# Patient Record
Sex: Female | Born: 1935 | Race: White | Hispanic: No | Smoking: Former smoker
Health system: Southern US, Community
[De-identification: ages and names within clinical notes are randomized; demographics above are authoritative.]

## PROBLEM LIST (undated history)

## (undated) DIAGNOSIS — R413 Other amnesia: Secondary | ICD-10-CM

## (undated) DIAGNOSIS — C50912 Malignant neoplasm of unspecified site of left female breast: Secondary | ICD-10-CM

## (undated) DIAGNOSIS — E876 Hypokalemia: Secondary | ICD-10-CM

## (undated) DIAGNOSIS — IMO0002 Reserved for concepts with insufficient information to code with codable children: Secondary | ICD-10-CM

## (undated) DIAGNOSIS — F32A Depression, unspecified: Secondary | ICD-10-CM

## (undated) DIAGNOSIS — E039 Hypothyroidism, unspecified: Secondary | ICD-10-CM

## (undated) DIAGNOSIS — I10 Essential (primary) hypertension: Secondary | ICD-10-CM

## (undated) DIAGNOSIS — M899 Disorder of bone, unspecified: Secondary | ICD-10-CM

## (undated) DIAGNOSIS — I509 Heart failure, unspecified: Secondary | ICD-10-CM

## (undated) DIAGNOSIS — E119 Type 2 diabetes mellitus without complications: Secondary | ICD-10-CM

## (undated) DIAGNOSIS — C50911 Malignant neoplasm of unspecified site of right female breast: Secondary | ICD-10-CM

## (undated) DIAGNOSIS — M810 Age-related osteoporosis without current pathological fracture: Secondary | ICD-10-CM

## (undated) DIAGNOSIS — F0393 Unspecified dementia, unspecified severity, with mood disturbance: Secondary | ICD-10-CM

## (undated) DIAGNOSIS — E785 Hyperlipidemia, unspecified: Secondary | ICD-10-CM

## (undated) DIAGNOSIS — K219 Gastro-esophageal reflux disease without esophagitis: Secondary | ICD-10-CM

## (undated) DIAGNOSIS — M545 Low back pain, unspecified: Secondary | ICD-10-CM

## (undated) DIAGNOSIS — F329 Major depressive disorder, single episode, unspecified: Secondary | ICD-10-CM

## (undated) DIAGNOSIS — F0390 Unspecified dementia without behavioral disturbance: Secondary | ICD-10-CM

## (undated) DIAGNOSIS — K5900 Constipation, unspecified: Secondary | ICD-10-CM

## (undated) DIAGNOSIS — M949 Disorder of cartilage, unspecified: Secondary | ICD-10-CM

## (undated) DIAGNOSIS — S42293A Other displaced fracture of upper end of unspecified humerus, initial encounter for closed fracture: Secondary | ICD-10-CM

## (undated) DIAGNOSIS — F419 Anxiety disorder, unspecified: Secondary | ICD-10-CM

## (undated) DIAGNOSIS — R011 Cardiac murmur, unspecified: Secondary | ICD-10-CM

## (undated) DIAGNOSIS — I4891 Unspecified atrial fibrillation: Secondary | ICD-10-CM

## (undated) DIAGNOSIS — R5381 Other malaise: Secondary | ICD-10-CM

## (undated) DIAGNOSIS — N1330 Unspecified hydronephrosis: Secondary | ICD-10-CM

## (undated) HISTORY — DX: Heart failure, unspecified: I50.9

## (undated) HISTORY — DX: Low back pain, unspecified: M54.50

## (undated) HISTORY — DX: Unspecified dementia, unspecified severity, with mood disturbance: F03.93

## (undated) HISTORY — DX: Unspecified dementia without behavioral disturbance: F03.90

## (undated) HISTORY — DX: Gastro-esophageal reflux disease without esophagitis: K21.9

## (undated) HISTORY — DX: Unspecified atrial fibrillation: I48.91

## (undated) HISTORY — DX: Hyperlipidemia, unspecified: E78.5

## (undated) HISTORY — DX: Major depressive disorder, single episode, unspecified: F32.9

## (undated) HISTORY — PX: TONSILLECTOMY: SHX5217

## (undated) HISTORY — DX: Cardiac murmur, unspecified: R01.1

## (undated) HISTORY — DX: Depression, unspecified: F32.A

## (undated) HISTORY — PX: APPENDECTOMY: SHX54

## (undated) HISTORY — DX: Essential (primary) hypertension: I10

## (undated) HISTORY — DX: Malignant neoplasm of unspecified site of right female breast: C50.911

## (undated) HISTORY — DX: Disorder of cartilage, unspecified: M94.9

## (undated) HISTORY — DX: Constipation, unspecified: K59.00

## (undated) HISTORY — DX: Anxiety disorder, unspecified: F41.9

## (undated) HISTORY — DX: Reserved for concepts with insufficient information to code with codable children: IMO0002

## (undated) HISTORY — DX: Hypothyroidism, unspecified: E03.9

## (undated) HISTORY — DX: Hypokalemia: E87.6

## (undated) HISTORY — DX: Low back pain: M54.5

## (undated) HISTORY — DX: Unspecified hydronephrosis: N13.30

## (undated) HISTORY — DX: Malignant neoplasm of unspecified site of left female breast: C50.912

## (undated) HISTORY — DX: Other displaced fracture of upper end of unspecified humerus, initial encounter for closed fracture: S42.293A

## (undated) HISTORY — DX: Other amnesia: R41.3

## (undated) HISTORY — DX: Disorder of bone, unspecified: M89.9

## (undated) HISTORY — DX: Other malaise: R53.81

## (undated) HISTORY — DX: Age-related osteoporosis without current pathological fracture: M81.0

## (undated) HISTORY — DX: Type 2 diabetes mellitus without complications: E11.9

---

## 2000-03-29 ENCOUNTER — Encounter: Payer: Self-pay | Admitting: General Surgery

## 2000-04-01 ENCOUNTER — Ambulatory Visit (HOSPITAL_COMMUNITY): Admission: RE | Admit: 2000-04-01 | Discharge: 2000-04-01 | Payer: Self-pay | Admitting: General Surgery

## 2000-04-01 ENCOUNTER — Encounter (INDEPENDENT_AMBULATORY_CARE_PROVIDER_SITE_OTHER): Payer: Self-pay

## 2000-11-01 HISTORY — PX: CORONARY ARTERY BYPASS GRAFT: SHX141

## 2000-12-06 ENCOUNTER — Encounter: Admission: RE | Admit: 2000-12-06 | Discharge: 2001-03-06 | Payer: Self-pay | Admitting: Internal Medicine

## 2000-12-16 ENCOUNTER — Encounter: Admission: RE | Admit: 2000-12-16 | Discharge: 2001-01-18 | Payer: Self-pay | Admitting: Internal Medicine

## 2002-11-01 DIAGNOSIS — C50911 Malignant neoplasm of unspecified site of right female breast: Secondary | ICD-10-CM

## 2002-11-01 HISTORY — DX: Malignant neoplasm of unspecified site of right female breast: C50.911

## 2002-11-01 HISTORY — PX: MASTECTOMY: SHX3

## 2004-04-06 ENCOUNTER — Other Ambulatory Visit: Admission: RE | Admit: 2004-04-06 | Discharge: 2004-04-06 | Payer: Self-pay | Admitting: General Surgery

## 2004-04-27 ENCOUNTER — Encounter: Admission: RE | Admit: 2004-04-27 | Discharge: 2004-04-27 | Payer: Self-pay | Admitting: General Surgery

## 2004-04-28 ENCOUNTER — Ambulatory Visit (HOSPITAL_BASED_OUTPATIENT_CLINIC_OR_DEPARTMENT_OTHER): Admission: RE | Admit: 2004-04-28 | Discharge: 2004-04-28 | Payer: Self-pay | Admitting: General Surgery

## 2004-04-28 ENCOUNTER — Ambulatory Visit (HOSPITAL_COMMUNITY): Admission: RE | Admit: 2004-04-28 | Discharge: 2004-04-28 | Payer: Self-pay | Admitting: General Surgery

## 2004-04-28 ENCOUNTER — Encounter (INDEPENDENT_AMBULATORY_CARE_PROVIDER_SITE_OTHER): Payer: Self-pay | Admitting: *Deleted

## 2004-04-28 ENCOUNTER — Encounter (INDEPENDENT_AMBULATORY_CARE_PROVIDER_SITE_OTHER): Payer: Self-pay | Admitting: General Surgery

## 2004-05-19 ENCOUNTER — Ambulatory Visit: Admission: RE | Admit: 2004-05-19 | Discharge: 2004-07-16 | Payer: Self-pay | Admitting: Radiation Oncology

## 2004-05-21 ENCOUNTER — Other Ambulatory Visit: Admission: RE | Admit: 2004-05-21 | Discharge: 2004-05-21 | Payer: Self-pay | Admitting: Obstetrics and Gynecology

## 2004-08-05 ENCOUNTER — Encounter: Admission: RE | Admit: 2004-08-05 | Discharge: 2004-08-05 | Payer: Self-pay | Admitting: Oncology

## 2004-08-05 ENCOUNTER — Encounter (INDEPENDENT_AMBULATORY_CARE_PROVIDER_SITE_OTHER): Payer: Self-pay | Admitting: *Deleted

## 2004-08-18 ENCOUNTER — Ambulatory Visit: Admission: RE | Admit: 2004-08-18 | Discharge: 2004-09-09 | Payer: Self-pay | Admitting: Radiation Oncology

## 2004-08-26 ENCOUNTER — Encounter: Admission: RE | Admit: 2004-08-26 | Discharge: 2004-08-26 | Payer: Self-pay | Admitting: General Surgery

## 2004-08-31 ENCOUNTER — Encounter: Admission: RE | Admit: 2004-08-31 | Discharge: 2004-08-31 | Payer: Self-pay | Admitting: General Surgery

## 2004-08-31 ENCOUNTER — Encounter: Payer: Self-pay | Admitting: General Surgery

## 2004-08-31 ENCOUNTER — Encounter (INDEPENDENT_AMBULATORY_CARE_PROVIDER_SITE_OTHER): Payer: Self-pay | Admitting: *Deleted

## 2004-09-10 ENCOUNTER — Ambulatory Visit (HOSPITAL_COMMUNITY): Admission: RE | Admit: 2004-09-10 | Discharge: 2004-09-10 | Payer: Self-pay | Admitting: General Surgery

## 2004-11-04 ENCOUNTER — Encounter (INDEPENDENT_AMBULATORY_CARE_PROVIDER_SITE_OTHER): Payer: Self-pay | Admitting: General Surgery

## 2004-11-04 ENCOUNTER — Inpatient Hospital Stay (HOSPITAL_COMMUNITY): Admission: RE | Admit: 2004-11-04 | Discharge: 2004-11-07 | Payer: Self-pay | Admitting: General Surgery

## 2004-11-04 ENCOUNTER — Encounter (INDEPENDENT_AMBULATORY_CARE_PROVIDER_SITE_OTHER): Payer: Self-pay | Admitting: *Deleted

## 2005-01-26 ENCOUNTER — Ambulatory Visit: Payer: Self-pay | Admitting: Oncology

## 2005-05-28 ENCOUNTER — Ambulatory Visit: Payer: Self-pay | Admitting: Oncology

## 2005-07-27 ENCOUNTER — Ambulatory Visit: Payer: Self-pay | Admitting: Oncology

## 2005-10-27 ENCOUNTER — Ambulatory Visit: Payer: Self-pay | Admitting: Oncology

## 2006-06-17 ENCOUNTER — Ambulatory Visit: Payer: Self-pay | Admitting: Oncology

## 2006-06-24 LAB — CBC WITH DIFFERENTIAL/PLATELET
BASO%: 0.2 % (ref 0.0–2.0)
Basophils Absolute: 0 10*3/uL (ref 0.0–0.1)
EOS%: 3.6 % (ref 0.0–7.0)
Eosinophils Absolute: 0.3 10*3/uL (ref 0.0–0.5)
HCT: 43.1 % (ref 34.8–46.6)
HGB: 14.5 g/dL (ref 11.6–15.9)
LYMPH%: 30.7 % (ref 14.0–48.0)
MCH: 30 pg (ref 26.0–34.0)
MCHC: 33.7 g/dL (ref 32.0–36.0)
MCV: 89.1 fL (ref 81.0–101.0)
MONO#: 0.5 10*3/uL (ref 0.1–0.9)
MONO%: 6 % (ref 0.0–13.0)
NEUT#: 5 10*3/uL (ref 1.5–6.5)
NEUT%: 59.5 % (ref 39.6–76.8)
Platelets: 227 10*3/uL (ref 145–400)
RBC: 4.83 10*6/uL (ref 3.70–5.32)
RDW: 15.6 % — ABNORMAL HIGH (ref 11.3–14.5)
WBC: 8.4 10*3/uL (ref 3.9–10.0)
lymph#: 2.6 10*3/uL (ref 0.9–3.3)

## 2006-06-24 LAB — COMPREHENSIVE METABOLIC PANEL
Alkaline Phosphatase: 42 U/L (ref 39–117)
BUN: 14 mg/dL (ref 6–23)
Glucose, Bld: 236 mg/dL — ABNORMAL HIGH (ref 70–99)
Sodium: 141 mEq/L (ref 135–145)
Total Bilirubin: 0.4 mg/dL (ref 0.3–1.2)
Total Protein: 6.9 g/dL (ref 6.0–8.3)

## 2007-06-18 ENCOUNTER — Ambulatory Visit: Payer: Self-pay | Admitting: Oncology

## 2007-11-02 HISTORY — PX: VERTEBROPLASTY: SHX113

## 2008-04-14 ENCOUNTER — Inpatient Hospital Stay (HOSPITAL_COMMUNITY): Admission: EM | Admit: 2008-04-14 | Discharge: 2008-04-18 | Payer: Self-pay | Admitting: Emergency Medicine

## 2008-04-14 ENCOUNTER — Ambulatory Visit (HOSPITAL_COMMUNITY): Admission: RE | Admit: 2008-04-14 | Discharge: 2008-04-14 | Payer: Self-pay | Admitting: Emergency Medicine

## 2008-04-16 ENCOUNTER — Encounter: Payer: Self-pay | Admitting: Neurosurgery

## 2008-04-16 ENCOUNTER — Encounter (INDEPENDENT_AMBULATORY_CARE_PROVIDER_SITE_OTHER): Payer: Self-pay | Admitting: Interventional Radiology

## 2008-08-20 ENCOUNTER — Ambulatory Visit (HOSPITAL_COMMUNITY): Admission: RE | Admit: 2008-08-20 | Discharge: 2008-08-20 | Payer: Self-pay | Admitting: Interventional Radiology

## 2008-08-22 ENCOUNTER — Encounter: Payer: Self-pay | Admitting: Interventional Radiology

## 2009-05-17 ENCOUNTER — Emergency Department (HOSPITAL_COMMUNITY): Admission: EM | Admit: 2009-05-17 | Discharge: 2009-05-17 | Payer: Self-pay | Admitting: Emergency Medicine

## 2009-05-29 ENCOUNTER — Ambulatory Visit (HOSPITAL_COMMUNITY): Admission: RE | Admit: 2009-05-29 | Discharge: 2009-05-29 | Payer: Self-pay | Admitting: Internal Medicine

## 2010-06-05 ENCOUNTER — Ambulatory Visit (HOSPITAL_COMMUNITY): Admission: RE | Admit: 2010-06-05 | Discharge: 2010-06-05 | Payer: Self-pay | Admitting: Internal Medicine

## 2010-06-05 ENCOUNTER — Ambulatory Visit: Payer: Self-pay | Admitting: Internal Medicine

## 2010-06-05 ENCOUNTER — Inpatient Hospital Stay (HOSPITAL_COMMUNITY): Admission: AD | Admit: 2010-06-05 | Discharge: 2010-06-08 | Payer: Self-pay | Admitting: Internal Medicine

## 2010-06-06 ENCOUNTER — Encounter: Payer: Self-pay | Admitting: Internal Medicine

## 2010-06-06 ENCOUNTER — Encounter (INDEPENDENT_AMBULATORY_CARE_PROVIDER_SITE_OTHER): Payer: Self-pay | Admitting: Internal Medicine

## 2010-06-11 ENCOUNTER — Telehealth: Payer: Self-pay | Admitting: Cardiology

## 2010-06-12 ENCOUNTER — Telehealth: Payer: Self-pay | Admitting: Internal Medicine

## 2010-11-18 ENCOUNTER — Other Ambulatory Visit: Payer: Self-pay | Admitting: Dermatology

## 2010-11-21 ENCOUNTER — Encounter: Payer: Self-pay | Admitting: General Surgery

## 2010-11-22 ENCOUNTER — Encounter: Payer: Self-pay | Admitting: Interventional Radiology

## 2010-12-01 NOTE — Procedures (Signed)
Summary: Upper Endoscopy  Patient: [name] Note: All result statuses are Final unless otherwise noted.  Tests: (1) Upper Endoscopy (EGD)   EGD Upper Endoscopy       DONE     [facility]     [address] [place], [place]  [address]           ENDOSCOPY PROCEDURE REPORT           PATIENT:  Selena Ward, Selena Ward  MR#:  [id number]     BIRTHDATE:  1918-09-17, 89 yrs. old  GENDER:  female           ENDOSCOPIST:  Hedwig Morton. Juanda Chance, MD     Referred by:  Murray Hodgkins, M.D.           PROCEDURE DATE:  06/06/2010     PROCEDURE:  EGD with biopsy, EGD with dilatation over guidewire     ASA CLASS:  Class III     INDICATIONS:  dysphagia barium swallow shos complete obstruction     at the level of g-e junction           MEDICATIONS:   Versed 2 mg, Fentanyl 25 mcg     TOPICAL ANESTHETIC:  Cetacaine Spray           DESCRIPTION OF PROCEDURE:   After the risks benefits and     alternatives of the procedure were thoroughly explained, informed     consent was obtained.  The  endoscope was introduced through the     mouth and advanced to the second portion of the duodenum, without     limitations.  The instrument was slowly withdrawn as the mucosa     was fully examined.     <<PROCEDUREIMAGES>>           A stricture was found in the distal esophagus. fobrotic, ulcerated     stricture covered with exudate, opened when scope gently pushed     through, appear ulcerated but no definite mass With standard     forceps, a biopsy was obtained and sent to pathology (see image7,     image6, and image8). Savary dilation over a guidewire 14mm and 16     mm dilators passed through  Presbyesophagus was found (see     image1). tortuous esophagus,  Otherwise the examination was normal     (see image5, image4, image3, and image2).    Retroflexed views     revealed no abnormalities.    The scope was then withdrawn from     the patient and the procedure completed.           COMPLICATIONS:  None       ENDOSCOPIC IMPRESSION:     1) Stricture in the distal esophagus     2) Presbyesophagus     3) Otherwise normal examination     s/p biopsies of the stricture which appears benign     s/p dilation under fluoroscopy to 70F     RECOMMENDATIONS:     1) Await biopsy results     2) Anti-reflux regimen to be follow     post dil orders     PPI           REPEAT EXAM:  In 0 year(s) for.  redilate prn           ______________________________     Hedwig Morton. Juanda Chance, MD           CC:  n.     eSIGNED:   Hedwig Morton. Brodie at 06/06/2010 12:25 PM           Jackelyn Hoehn, [id number]  Note: An exclamation mark (!) indicates a result that was not dispersed into the flowsheet. Document Creation Date: 06/08/2010 9:00 AM _______________________________________________________________________  (1) Order result status: Final Collection or observation date-time: 06/06/2010 12:10 Requested date-time:  Receipt date-time:  Reported date-time:  Referring Physician:   Ordering Physician: Lina Sar [phone]) Specimen Source:  Source: Launa Grill Order Number: [id number] Lab site:

## 2010-12-01 NOTE — Progress Notes (Signed)
Summary: f/u appt  Phone Note Call from Patient Call back at [phone]   Caller: Bonita Quin [facility] Call For: Dr. Juanda Chance Reason for Call: Talk to Nurse Summary of Call: (if Bonita Quin is not available, ask for Mia)  thinks pt needs a f/u appt after hospital procedure, but not sure when pt should come back. Initial call taken by: Vallarie Mare,  June 12, 2010 8:35 AM  Follow-up for Phone Call        appointment as needed difficullt swallowing. If she has no problem I don't have to see her. She was H.pylori positive but it does not have to be treated at her age. Follow-up by: Hart Carwin MD,  June 12, 2010 7:04 PM     Appended Document: f/u appt Nurse at [facility] notified.

## 2010-12-01 NOTE — Progress Notes (Signed)
Summary: question re appt  Phone Note Call from Patient   Caller: Patient Reason for Call: Talk to Nurse Summary of Call: friends home calling to see when pt due for hospital fu-dc paper didn't say-pls call [phone] mia or linda Initial call taken by: Glynda Jaeger,  June 11, 2010 10:16 AM  Follow-up for Phone Call        I called and spoke with Bonita Quin at [facility]. I explained they needed to call Dr. Juanda Chance in GI. I have given her the # to call.  Follow-up by: Sherri Rad, RN, BSN,  June 11, 2010 4:19 PM

## 2011-01-15 LAB — DIFFERENTIAL
Basophils Absolute: 0 10*3/uL (ref 0.0–0.1)
Basophils Relative: 0 % (ref 0–1)
Eosinophils Absolute: 0.2 10*3/uL (ref 0.0–0.7)
Eosinophils Relative: 2 % (ref 0–5)
Lymphocytes Relative: 37 % (ref 12–46)
Lymphs Abs: 3.8 10*3/uL (ref 0.7–4.0)
Monocytes Absolute: 0.9 10*3/uL (ref 0.1–1.0)
Monocytes Relative: 8 % (ref 3–12)
Neutro Abs: 5.6 10*3/uL (ref 1.7–7.7)
Neutrophils Relative %: 53 % (ref 43–77)

## 2011-01-15 LAB — CBC
HCT: 41.5 % (ref 36.0–46.0)
HCT: 41.8 % (ref 36.0–46.0)
Hemoglobin: 14.5 g/dL (ref 12.0–15.0)
MCH: 31.1 pg (ref 26.0–34.0)
MCHC: 33.8 g/dL (ref 30.0–36.0)
MCHC: 34.6 g/dL (ref 30.0–36.0)
MCV: 90 fL (ref 78.0–100.0)
MCV: 91.5 fL (ref 78.0–100.0)
Platelets: 235 10*3/uL (ref 150–400)
Platelets: 235 10*3/uL (ref 150–400)
RBC: 4.65 MIL/uL (ref 3.87–5.11)
RDW: 13.4 % (ref 11.5–15.5)
RDW: 14.2 % (ref 11.5–15.5)
WBC: 10.5 10*3/uL (ref 4.0–10.5)
WBC: 11.3 10*3/uL — ABNORMAL HIGH (ref 4.0–10.5)

## 2011-01-15 LAB — GLUCOSE, CAPILLARY
Glucose-Capillary: 101 mg/dL — ABNORMAL HIGH (ref 70–99)
Glucose-Capillary: 102 mg/dL — ABNORMAL HIGH (ref 70–99)
Glucose-Capillary: 115 mg/dL — ABNORMAL HIGH (ref 70–99)
Glucose-Capillary: 119 mg/dL — ABNORMAL HIGH (ref 70–99)
Glucose-Capillary: 120 mg/dL — ABNORMAL HIGH (ref 70–99)
Glucose-Capillary: 123 mg/dL — ABNORMAL HIGH (ref 70–99)
Glucose-Capillary: 129 mg/dL — ABNORMAL HIGH (ref 70–99)
Glucose-Capillary: 136 mg/dL — ABNORMAL HIGH (ref 70–99)
Glucose-Capillary: 151 mg/dL — ABNORMAL HIGH (ref 70–99)
Glucose-Capillary: 168 mg/dL — ABNORMAL HIGH (ref 70–99)

## 2011-01-15 LAB — COMPREHENSIVE METABOLIC PANEL
ALT: 18 U/L (ref 0–35)
AST: 23 U/L (ref 0–37)
Albumin: 4 g/dL (ref 3.5–5.2)
Alkaline Phosphatase: 34 U/L — ABNORMAL LOW (ref 39–117)
BUN: 14 mg/dL (ref 6–23)
CO2: 26 mEq/L (ref 19–32)
Calcium: 8.7 mg/dL (ref 8.4–10.5)
Chloride: 104 mEq/L (ref 96–112)
Creatinine, Ser: 0.97 mg/dL (ref 0.4–1.2)
GFR calc Af Amer: >60 mL/min (ref >60)
GFR calc non Af Amer: 54 mL/min — ABNORMAL LOW (ref >60)
Glucose, Bld: 103 mg/dL — ABNORMAL HIGH (ref 70–99)
Potassium: 3.4 mEq/L — ABNORMAL LOW (ref 3.5–5.1)
Sodium: 140 mEq/L (ref 135–145)
Total Bilirubin: 0.8 mg/dL (ref 0.3–1.2)
Total Protein: 6.9 g/dL (ref 6.0–8.3)

## 2011-01-15 LAB — BASIC METABOLIC PANEL
BUN: 14 mg/dL (ref 6–23)
Chloride: 104 mEq/L (ref 96–112)
Creatinine, Ser: 1.02 mg/dL (ref 0.4–1.2)
Glucose, Bld: 125 mg/dL — ABNORMAL HIGH (ref 70–99)

## 2011-01-15 LAB — MAGNESIUM: Magnesium: 2.4 mg/dL (ref 1.5–2.5)

## 2011-01-15 LAB — HEMOGLOBIN A1C
Hgb A1c MFr Bld: 7 % — ABNORMAL HIGH (ref <5.7)
Mean Plasma Glucose: 154 mg/dL — ABNORMAL HIGH (ref <117)

## 2011-01-15 LAB — MRSA PCR SCREENING: MRSA by PCR: NEGATIVE

## 2011-01-15 LAB — APTT: aPTT: 28 seconds (ref 24–37)

## 2011-01-15 LAB — PROTIME-INR
INR: 1.17 (ref 0.00–1.49)
Prothrombin Time: 15.1 seconds (ref 11.6–15.2)

## 2011-02-07 LAB — URINALYSIS, ROUTINE W REFLEX MICROSCOPIC
Bilirubin Urine: NEGATIVE
Hgb urine dipstick: NEGATIVE
Ketones, ur: NEGATIVE mg/dL
Nitrite: NEGATIVE
Protein, ur: 30 mg/dL — AB
Urobilinogen, UA: 0.2 mg/dL (ref 0.0–1.0)

## 2011-02-07 LAB — URINE CULTURE: Colony Count: 9000

## 2011-02-07 LAB — CBC
HCT: 45.7 % (ref 36.0–46.0)
Platelets: 240 10*3/uL (ref 150–400)
RDW: 15.4 % (ref 11.5–15.5)
WBC: 9.6 10*3/uL (ref 4.0–10.5)

## 2011-02-07 LAB — COMPREHENSIVE METABOLIC PANEL
AST: 31 U/L (ref 0–37)
Albumin: 4.3 g/dL (ref 3.5–5.2)
BUN: 14 mg/dL (ref 6–23)
Chloride: 103 mEq/L (ref 96–112)
Creatinine, Ser: 1 mg/dL (ref 0.4–1.2)
GFR calc Af Amer: >60 mL/min (ref >60)
Potassium: 4.2 mEq/L (ref 3.5–5.1)
Total Protein: 7.2 g/dL (ref 6.0–8.3)

## 2011-02-07 LAB — DIFFERENTIAL
Eosinophils Relative: 0 % (ref 0–5)
Lymphocytes Relative: 20 % (ref 12–46)
Monocytes Absolute: 0.4 10*3/uL (ref 0.1–1.0)
Monocytes Relative: 4 % (ref 3–12)
Neutro Abs: 7.2 10*3/uL (ref 1.7–7.7)

## 2011-02-07 LAB — LACTIC ACID, PLASMA: Lactic Acid, Venous: 2 mmol/L (ref 0.5–2.2)

## 2011-02-07 LAB — URINE MICROSCOPIC-ADD ON

## 2011-03-16 NOTE — Consult Note (Signed)
Selena Ward, Selena Ward              ACCOUNT NO.:  [id number]   MEDICAL RECORD NO.:  [id number]          PATIENT TYPE:  INP   LOCATION:  1423                         FACILITY:  [facility]   PHYSICIAN:  Hilda Lias, M.D.   DATE OF BIRTH:  15-Oct-1918   DATE OF CONSULTATION:  04/14/2008  DATE OF DISCHARGE:                                 CONSULTATION   Ms. Koplin is an 77 year old female who fell yesterday in the facility  where she lives.  Immediately, she developed back pain, which got worse  today.  Because of the finding, she was brought to [facility] Emergency  Room, where she was fully evaluated and x-rays were obtained.  We were  called for evaluation as well her medical doctor, Dr. Evlyn Kanner.  Clinically, the patient is awake and is oriented x3.  She is complaining  of back pain mostly with moving.  She denies having any problem in the  past.  Clinically, she has a normal sensation and she is able to move  both legs, although with some discomfort secondary to the pain.  Sensory  is normal, reflexes 1+.  She has not had any problem with bladder or  bowel movement.  The x-ray shows a fracture of L3 with 5 mm retropulsion  to the canal.  Also, she has had fracture of endplate of L2 and stenosis  at L4-L5.  Clinical impression: The fracture at L3, most likely  traumatic.  Lumbar stenosis at L4-L5, fracture at the endplate of L2.   RECOMMENDATION:  The patient is going to be admitted either by her  personal medical doctor or the hospitalist.  They are trying to find out  which one to admit her.  This lady is going to need a vertebroplasty.  This can be done at [facility] either by [place], [facility] or by the interventional radiologist to see who is available  first.  In the meantime, she is going to be admitted to the floor here  at [facility] and once we made the arrangement for vertebroplasty, she  is going to be transferred.  The decision  right now is to find out  which service will be taking care of her.  Of course, we are consultant  and we are not the primary physician.           ______________________________  Hilda Lias, M.D.     EB/MEDQ  D:  04/14/2008  T:  04/15/2008  Job:  [id number]

## 2011-03-16 NOTE — H&P (Signed)
Selena Ward, Selena Ward              ACCOUNT NO.:  [id number]   MEDICAL RECORD NO.:  [id number]          PATIENT TYPE:  INP   LOCATION:  1423                         FACILITY:  [facility]   PHYSICIAN:  Della Goo, M.D. DATE OF BIRTH:  [birth year]   DATE OF ADMISSION:  04/14/2008  DATE OF DISCHARGE:                              HISTORY & PHYSICAL   PRIMARY CARE PHYSICIAN:  Unassigned   CHIEF COMPLAINTS:  Severe back pain.   HISTORY OF PRESENT ILLNESS:  This is an 77 year old female who was  brought to the emergency department secondary to severe the unremitting  low back pain.  The patient reports suffering a fall one day before, and  reports this fall happened when she was reaching to open the  refrigerator.  She states that she did not collapse or have syncope  associated with this fall.  She denies having any dizziness or vertigo.  She also denies having any chest pain associated with this event.  The  patient describes her pain as being a 10/10 and it is located across the  low back.  There is no radiation down the legs..  She denies having any  loss of bowel or bladder function.   PAST MEDICAL HISTORY:  1. Hypertension.  2. Hyperlipidemia.  3. Type 2 diabetes mellitus.  4. History of breast CA bilaterally status post bilateral simple      mastectomies January 2006.  The patient also has had a total of      three breast surgeries initially, excision of a mass in the left      breast, then left partial mastectomy, followed by bilateral simple      mastectomies.   MEDICATIONS:  1. Lasix 20 mg one p.o. daily.  2. Lanoxin 0.125 mg one p.o. daily.  3. Perphenazine/amitriptyline 2/25 mg one p.o. q.h.s.  4. Enalapril 10 mg one p.o. daily.  5. Centrum multivitamin one p.o. daily.  6. Atenolol 50 mg one p.o. daily.  7. Potassium chloride 20 mEq one p.o. daily.  8. Aspirin 325 mg one p.o. daily.  9. Amaryl 2 mg one p.o. daily.  10.Calcium 500 mg one p.o. daily.  11.Crestor 10 mg  one p.o. daily.  12.Zetia 10 mg one p.o. daily.  13.Fosamax 70 mg one p.o. weekly.  14.Fish oil 2 capsules twice daily.  15.Coenzyme Q-10 50 mg one p.o. daily.   ALLERGIES:  Patient has Niaspan listed as an allergy in her medical  record.   SOCIAL HISTORY:  The patient is a nonsmoker, nondrinker.  She is a  resident at the [facility] assisted living and nursing home  facility.   FAMILY HISTORY:  Noncontributory.   PHYSICAL EXAMINATION FINDINGS:  GENERAL:  This is a pleasant 89-year-  old, well-nourished, well-developed female in discomfort but no acute  distress.  VITAL SIGNS:  Temperature 98.4, blood pressure 169/81, heart rate 87,  respirations 20, O2 saturations 93-96%.  HEENT:  Normocephalic, atraumatic.  There is no scleral icterus.  Pupils  are equally round, reactive to light.  Extraocular muscles are intact.  Funduscopic benign.  Oropharynx is clear.  NECK:  Supple, full range of motion.  No thyromegaly, adenopathy or  jugular venous distention.  CARDIOVASCULAR:  Regular rate and rhythm.  No murmurs, gallops or rubs.  LUNGS:  Clear to auscultation bilaterally.  ABDOMEN:  Positive bowel sounds, soft, nontender, nondistended.  EXTREMITIES:  Without cyanosis, clubbing or edema.Marland Kitchen  NEUROLOGIC:  The patient is alert and oriented x3.  Her cranial nerves  are intact.  Her motor and sensory function are also intact.   LABORATORY STUDIES:  White blood cell count 12.6, hemoglobin 14.5,  hematocrit 42.6, platelets 198, neutrophils 81%, lymphocytes 13%.  Sodium 138, potassium 4.1, chloride 98, bicarb 27, BUN 14, creatinine  0.81 and glucose 226.  Albumin 3.6, AST 35, ALT 20.  CT scan of the  thoracic spine and lumbar spine reveal severe degenerative disk disease,  comminuted compression fracture of the L3 vertebral body with  retropulsion of bone into the spinal canal by about 5 mm resulting in  moderate central spinal canal stenosis.  Also, mild superior end plate   compression fracture of the L2 vertebral body and severe degenerative  disk disease with bilateral facet degenerative joint disease at L4-L5  with grade 1 and anterior listhesis.  The T-spine area reveals no acute  thoracic spine fracture or subluxation of levels T7 through T12,  osteopenia and mild degenerative disk disease present.   ASSESSMENT:  An 77 year old female being admitted with:  1. Compression fractures L2 and L3.  2. Intractable back pain secondary #1.  3. Hypertension and elevated blood pressure.  4. Hyperglycemia with type 2 diabetes mellitus.  5. Coronary artery disease.  6. Hyperlipidemia.  7. Osteopenia.   PLAN:  The patient will be admitted to a telemetry area for monitoring.  Pain control therapy has been ordered.  She will continue on her regular  medications and sliding scale insulin coverage will be ordered.  DVT and  GI prophylaxis have also been ordered.  A neurosurgery consultation has  already been performed by Dr. Eliane Decree.      Della Goo, M.D.  Electronically Signed     HJ/MEDQ  D:  04/15/2008  T:  04/15/2008  Job:  [id number]

## 2011-03-16 NOTE — Discharge Summary (Signed)
[name], Ward NO.:  [id number]   MEDICAL RECORD NO.:  [id number]          PATIENT TYPE:  OUT   LOCATION:  XRAY                         FACILITY:  [facility]   PHYSICIAN:  Hillery Aldo, M.D.   DATE OF BIRTH:  November 23, 1917   DATE OF ADMISSION:  04/16/2008  DATE OF DISCHARGE:  04/16/2008                               DISCHARGE SUMMARY   DATE OF ADMISSION:  April 14, 2008   DATE OF DISCHARGE:  April 18, 2008   PRIMARY CARE PHYSICIAN:  Dr. Waynard Edwards.   DISCHARGE DIAGNOSES:  1. L3 vertebral compression fracture L3, mild superior endplate      compression fracture of L2 vertebral body status post      vertebroplasty.  2. Severe degenerative disk disease and bilateral facet degenerative      joint disease at L4-5 with grade 1 anterolisthesis.  3. Back pain secondary to number one.  4. Hypertension.  5. Diabetes mellitus.  6. Dyslipidemia.  7. Hypokalemia.  8.. History of breast cancer.  9.. Osteopenia.  1. Right hydronephrosis of unknown etiology.  2. Severe spinal stenosis at L3-L4 and L4-L5.   DISCHARGE MEDICATIONS:  1. Amaryl 2 mg daily.  2. Tenormin 50 mg daily.  3. Centrum 1 tablet daily.  4  Enalapril 20 mg daily.  1. Furosemide 20 mg daily.  2. Lanoxin 0.125 mg daily.  3. Perphenazine/amitriptyline 2/25 1 tablet q.h.s.  4. Potassium chloride 20 mEq daily.  9.. Aspirin 325 mg daily.  1. Calcium plus D 600 mg daily.  2. Co-enzyme q. 1050 mg daily.  3. Crestor 10 mg daily.  4. Fish oil 2 capsules b.i.d.  5. Fosamax 70 mg weekly.  6. Zetia 10 mg daily.  7. Senokot 1 tablet q.h.s. p.r.n.   CONSULTATIONS:  1. Dr. Jeral Fruit of neurosurgery  2. Dr. Corliss Skains of interventional radiology.   BRIEF ADMISSION HPI:  The patient is an 77 year old female, who fell in  her home and had resultant increase in lower back pain.  She was brought  to the hospital by EMS and found to have an L3 compression fracture.  For the full details, please see the dictated report  done by Dr.  Lovell Sheehan.   PROCEDURES AND DIAGNOSTIC STUDIES:  1. CT scan of the thoracic and lumbar spine on April 14, 2008, showed      no evidence acute thoracic spine fracture or subluxation from      levels of T7-T12.  Osteopenia and mild degenerative disk disease.      The comminuted compression fracture of the L3 vertebral body with      retropulsion of bone into the spinal canal by approximately 5 mm.      This resulted in moderate central spinal canal stenosis.  Mild      superior endplate compression fracture of L2 vertebral body.      Severe degenerative disk disease and bilateral facet degenerative      joint disease at L4-L5 with grade 1 anterolisthesis.  Degenerative      lumbar scoliosis.  Generalized osteopenia.  2. MRI of the thoracic and lumbar spine  on April 15, 2008, showed an      acute comminuted fracture of L3 with no discrete significant bony      impingement upon the spinal canal.  There was a separate fragment      of the posterior superior aspect of the vertebral body slightly      protruding into the spinal canal.  Severe spinal stenosis at L4-L5      with almost complete obliteration of the thecal sac due to      posterior element hypertrophy and spondylolisthesis.  Severe spinal      stenosis at L3-L4 due to a combination of retrolisthesis, posterior      element hypertrophy and a small soft disk herniation.  Old      compression fracture of the endplate of L2.   DISCHARGE LABORATORY VALUES:  Sodium was 137, potassium 3.4 (replete,)  chloride 101, bicarb 31, BUN 11, creatinine 0.67, glucose 152.  White  blood cell count was 8.6, hemoglobin 13.2, hematocrit 39.3, platelets  198.   ASSESSMENT:  1. L3 vertebral compression fracture:  The patient did undergo      vertebroplasty with subsequent dramatic improvement in her back      discomfort.  Nevertheless, she remains weak in the lower      extremities.  She will need rehabilitation with intensive physical       and occupational therapy and at present, she was transferred from      an assisted living facility.  The patient has agreed to go to rehab      for this therapy.  She will continue on her bone building therapies      as well.  2. Hypertension:  The patient has had mildly elevated systolic blood      pressures in the 150s, but is on multiple medications to control      her blood pressure.  We will leave further dose titration of her      medications up to her primary care physician.  3. Diabetes:  The patient has had excellent control.  Hemoglobin A1c      was checked and found to be 6.8%.  4. Dyslipidemia:  The patient is being treated with Zeta and Lovaza,      which were continued while here in the hospital.  5. Hypokalemia:  The patient was supplemented.  6. History of breast cancer:  The patient will follow up with her      oncologist.  7. Osteopenia:  As per pharmacy protocol, her bone building therapies      were held.  She should continue these at discharge.   DISPOSITION:  The patient's disposition is not yet set but is expected  that she will be transferred to a rehabilitation facility once such a  facility is identified and her insurance approves this transfer.  She is  medically stable otherwise.      Hillery Aldo, M.D.  Electronically Signed     CR/MEDQ  D:  04/18/2008  T:  04/18/2008  Job:  [id number]   cc:   Loraine Leriche A. Perini, M.D.  Fax: [phone]

## 2011-03-16 NOTE — Consult Note (Signed)
NAMERENIKA, SHIFLET              ACCOUNT NO.:  [id number]   MEDICAL RECORD NO.:  [id number]          PATIENT TYPE:  OUT   LOCATION:  XRAY                         FACILITY:  [facility]   PHYSICIAN:  Sanjeev K. Deveshwar, M.D.DATE OF BIRTH:  Jul 10, 1918   DATE OF CONSULTATION:  08/22/2008  DATE OF DISCHARGE:                                 CONSULTATION   CHIEF COMPLAINT:  Back pain.   HISTORY OF PRESENT ILLNESS:  This is an 77 year old female with a long  history of back pain.  She was admitted to [facility] in June  2009, for evaluation.  She was seen in consultation by Dr. Jeral Fruit and  MRI at that time showed a L3 compression fracture, which was felt to be  acute.  The patient was also noted to have an old fracture at L2 as well  as severe spinal stenosis.  The patient underwent an L3 kyphoplasty on  April 16, 2008, performed by Dr. Corliss Skains with significant relief of her  pain.  A pathology report showed no sign of malignancy.   The patient does have a history of falls.  Recently, she reported  worsening back pain.  An MRI was performed on August 20, 2008.  This  showed an old fracture at L2 as well as the previously treated fracture  at L3, which was felt to be healed.  There was no new fracture.  Again,  the patient was noted to have severe spinal stenosis at L2-L3, L3-L4,  and L4-L5.  It was felt that this was probably the etiology of her pain.  The patient presents today accompanied by her son to discuss these  findings with Dr. Corliss Skains.   PAST MEDICAL HISTORY:  Significant for:  1. Diabetes mellitus.  2. Hyperlipidemia.  3. Hypertension.  4. Coronary artery disease.  5. As noted, she has a history of falls.  6. She has osteopenia.  7. She has a history of breast cancer.  8. She is currently a no code blue.   SURGICAL HISTORY:  The patient had bilateral mastectomies in 2006.  She  has had coronary artery bypass graft surgery.   ALLERGIES:  Include NIASPAN and  NONSTEROIDAL ANTI-INFLAMMATORY DRUGS.   CURRENT MEDICATIONS:  1. p.r.n. Tylenol.  2. Enalapril 20 mg daily.  3. Fentanyl patches 25 mcg every 72 hours.  4. Ativan p.r.n.  5. Amaryl 2 mg daily.  6. Fosamax 70 mg each week.  7. Tenormin 50 mg daily.  8. Multivitamin daily.  9. Lasix 20 mg daily.  10.Lanoxin 0.125 mg daily.  11.Calcium with vitamin D.  12.MiraLax.  13.Potassium supplement.  14.Crestor 10 mg daily.  15.Zetia 10 mg daily.   SOCIAL HISTORY:  The patient currently resides at friend's home.  She  does not use alcohol or tobacco.   FAMILY HISTORY:  Noncontributory.   IMPRESSION AND PLAN:  As noted, the patient recently had an MRI  performed on August 20, 2008, to evaluate her back pain.  This did show  an old healed fracture at L2 as well as a previously-treated fracture at  L3, which was also  felt to be healed and stable.  There was no new  fracture.  The patient did have the spinal stenosis as noted above.   This was explained to the patient and her son.  Dr. Corliss Skains did review  the images with the patient and her son and pointed out the areas of  stenosis.  It was felt that the stenosis was responsible for her pain.  The patient is somewhat of a difficult historian and is unable to give  an accurate description of her pain, although she does report that it is  relieved with Tylenol.  Reviewing her medications, it also appears that  she is on fentanyl patches.   The patient reports that she is not interested in further interventions.  Dr. Corliss Skains explained that if her pain became worse or was not  responding to the pain medications, then an epidural injections could be  considered.  There is also a minimally invasive procedure known as an X-  STOP that can be used to treat spinal stenosis.  Dr. Corliss Skains does not  perform this procedure.  He felt that if further intervention was  indicated, the patient should follow up with Dr. Jeral Fruit, the  neurosurgeon who  has seen her in the past.  Further followup with Dr.  Corliss Skains should be on a p.r.n. basis.  He would be happy to see her for  any new compression fractures.  Greater than 15 minutes was spent on  this consult.      Delton See, P.A.    ______________________________  Grandville Silos. Corliss Skains, M.D.    DR/MEDQ  D:  08/22/2008  T:  08/23/2008  Job:  [id number]   cc:   Loraine Leriche A. Perini, M.D.  Hilda Lias, M.D.  Lenon Curt Chilton Si, M.D.

## 2011-03-19 NOTE — Op Note (Signed)
[facility]  Patient:    Selena Ward, Selena Ward                     MRN: [id number] Proc. Date: 04/01/00 Adm. Date:  [id number] Disc. Date: [id number] Attending:  Carson Myrtle                           Operative Report  PREOPERATIVE DIAGNOSIS:  Mass, left breast.  POSTOPERATIVE DIAGNOSIS:  Mass, left breast.  OPERATION PERFORMED:  Excision of mass, left breast.  SURGEON:  Timothy E. Earlene Plater, M.D.  ANESTHESIA:  Local standby.  INDICATIONS FOR PROCEDURE:  [name] is an otherwise healthy 77 year old Caucasian female cared for by Dr. Eldred Manges on a longterm basis.  Her medical situations, medications are all stable.  She has had a persistent nodule in the upper outer quadrant left breast that does trouble her somewhat.  Also it is seen on mammography.  She wishes to have a complete excisional biopsy after options were discussed.  DESCRIPTION OF PROCEDURE:  The patient was brought to the operating room and placed supine with head slightly elevated, arms outstretched.  The left breast was prepped and draped in the usual fashion.  The IV had been started and sedation was given.  The mass was palpable and the midaspect of the upper outer quadrant left breast, approximately 2 oclock position, approximately 6 cm outside the areolar edge.  This area was infiltrated with 0.25% Marcaine with epinephrine and a generous incision was made.  Subcutaneous tissue was dissected and the nodularity, i.e. mass was grasped completely and sharply excised from the surrounding tissue.  Bleeding was visualized and cauterized and the wound was dry.  A small nodule inferior towards the nipple was also excised separately and submitted with the larger mass.  The wound was dry.  It was closed with 3-0 Vicryl and 4-0 Monocryl.  Counts correct. Steri-Strips and dry sterile dressing applied.  Written and verbal instructions were given to the patient including Darvocet and she will  be seen and followed as an outpatient. DD:  04/01/00 TD:  04/05/00 Job: [id number]

## 2011-03-19 NOTE — Op Note (Signed)
NAME:  MOSSIE, GILDER                        ACCOUNT NO.:  [id number]   MEDICAL RECORD NO.:  [id number]                   PATIENT TYPE:  AMB   LOCATION:  DSC                                  FACILITY:  [facility]   PHYSICIAN:  Timothy E. Earlene Plater, M.D.              DATE OF BIRTH:  July 10, 1918   DATE OF PROCEDURE:  DATE OF DISCHARGE:                                 OPERATIVE REPORT   PREOPERATIVE DIAGNOSIS:  Carcinoma, left breast.   POSTOPERATIVE DIAGNOSIS:  Carcinoma, left breast.   PROCEDURE:  Left partial mastectomy and sentinel node biopsy x2.   SURGEON:  Timothy E. Earlene Plater, MD   ANESTHESIA:  General.   Miss Passey is 49, was recently discovered to have a palpable mass in the  left breast with abnormal mammogram.  Needle aspiration cytology  was  positive.  The patient was carefully counseled and wishes to proceed with  the surgery.  It has been carefully explained to her and her son.  She was  seen and evaluated by Anesthesia.  Her other laboratory data are  satisfactory.  The patient had been injected with sulfur colloid at 12:25.  Surgery began promptly at 2.   The patient was seen, interviewed, identified, the left breast marked, and  the permit signed.   She was taken to the operating room, placed supine, LMA anesthesia provided.  The arms were gently outstretched.  The left breast was inspected.  The mass  was palpable at the 3:30 position, the left breast approximately 8 cm  outside the areolar edge.  Methylene blue was injected subcutaneously about  nipple-areolar complex and massaged well.  Then the entire left chest was  prepped and draped in the usual fashion.  Using the Neo-Probe, there was one  hot spot over the left axilla.  To note, the supraclavicular and mid-sternal  areas on the left were negative.  An incision was made over the left axilla  and 2 prominent nodes were visible and palpable.  Number one was hot, but  not blue. Number two was blue and hot.  Each was  dissected out separately  and sent to Pathology.  Bleeding was controlled with the cautery and the  wound was dry.   Attention was turned to the left breast where a generous ellipse of skin,  subcutaneous tissue, and breast tissue was made over the palpable mass.  This was marked and sent to Pathology in saline.  Likewise, because of the  size and location, I excised another ellipse of skin that was medial and  posterior to the actual mass.  This was submitted as second specimen.  That  wound was carefully visualized,  cautery was used where necessary, and that  wound was closed in layers with 3-0 Monocryl.  Steri-Strips applied.  The  pathologist returned a call that both lymph nodes were negative for tumor.  Therefore, the axillary wound was likewise closed with 3-0  Monocryl and  Steri-Strips.  Dry, sterile dressing applied.  All counts correct.  She  tolerated it well, was awakened and taken to the recovery room in good  condition.   Written and verbal instructions given her and her son including Vicodin,  #24, refill 1, and she will be seen and followed as an outpatient.                                               Timothy E. Earlene Plater, M.D.    TED/MEDQ  D:  04/28/2004  T:  04/28/2004  Job:  [id number]   cc:   Loraine Leriche A. Waynard Edwards, M.D.  [address]  [place]  [place] [address]  Fax: [phone]   [facility]

## 2011-03-19 NOTE — Discharge Summary (Signed)
NAMESUMAIYAH, Selena Ward              ACCOUNT NO.:  [id number]   MEDICAL RECORD NO.:  [id number]          PATIENT TYPE:  INP   LOCATION:  5156                         FACILITY:  [facility]   PHYSICIAN:  Cherylynn Ridges III, M.D.DATE OF BIRTH:  1918/03/02   DATE OF ADMISSION:  11/04/2004  DATE OF DISCHARGE:                                 DISCHARGE SUMMARY   DISCHARGE DIAGNOSIS:  Bilateral breast cancer.   PRINCIPAL PROCEDURE:  Bilateral simple mastectomies.  She has two 19 mm  Blake drains left in place, flat drains, which I will remove in my office.   DIET:  Regular.   CONDITION:  Stable.   MEDICATIONS ON DISCHARGE:  1.  Amaryl 2 mg p.o. daily.  2.  K-Dur 20 mEq p.o. daily.  3.  Lanoxin 0.125 mg p.o. daily.  4.  Lasix 20 mg daily.  5.  Tenormin 50 mg p.o. daily.  6.  Vasotec 10 mg p.o. daily.  7.  Darvocet-N 100 to take p.r.n. for pain.   WOUND CARE:  She is to have her dressing changed every-other day.  Paint the  wound directly with Betadine, place Xeroform gauze on top of that, then  cover that with 4x4's and then a dry gauze denies.  Drain sites should also  be dressed just with plain gauze.   She is to return to see me on Tuesday, November 10, 2004.   BRIEF SUMMARY OF HOSPITAL COURSE:  The patient was admitted for bilateral  simple mastectomies for bilateral breast cancer.  The left side was a  recurrence.  Her procedure went well.  No axillary lymph node dissection was  done.  She was had Blake drains placed underneath the superior flaps.   Postoperatively she did well, although on uncovering her wounds on  postoperative day #2 she had bilateral upper flap ischemia in the mid  portion.  This was not sloughing and had not pulled apart, staples were  intact.  However, I  expect that over time this will need to be debrided and it will contract  down and heal over time.  Blake drains are in placed, draining about 20-40  mL each drain per shift.  We will consider removing that  when she comes into  the office on January 10.      Jame   JOW/MEDQ  D:  11/06/2004  T:  11/06/2004  Job:  [id number]   cc:   Loraine Leriche A. Waynard Edwards, M.D.  [address]  [place]  [place] [address]  Fax: [phone]

## 2011-03-19 NOTE — Op Note (Signed)
Selena Ward, Selena Ward              ACCOUNT NO.:  [id number]   MEDICAL RECORD NO.:  [id number]          PATIENT TYPE:  INP   LOCATION:  2899                         FACILITY:  [facility]   PHYSICIAN:  Jimmye Norman III, M.D.  DATE OF BIRTH:  [birth year]   DATE OF PROCEDURE:  11/04/2004  DATE OF DISCHARGE:                                 OPERATIVE REPORT   PREOPERATIVE DIAGNOSES:  Bilateral breast cancer.   POSTOPERATIVE DIAGNOSES:  Bilateral breast cancer.   OPERATION PERFORMED:  Bilateral simple mastectomy.   SURGEON:  Marta Lamas. Lindie Spruce, M.D.   ASSISTANT:  Anselm Pancoast. Zachery Dakins, M.D.   ANESTHESIA:  General endotracheal.   ESTIMATED BLOOD LOSS:  100 to 150 mL.   COMPLICATIONS:  None.   CONDITION:  Good.   INDICATIONS FOR PROCEDURE:  The patient is an 77 year old female who had a  previous lumpectomy and sentinel node on the left side with recurrent  disease on that side and a newly diagnosed left breast cancer on the right  side.  Because of the recurrent nature of the disease and the  multicentricity of her disease on the left side, she has elected for simple  mastectomies without node biopsy.   FINDINGS:  The patient had no obvious disease in the areas where we did our  dissection.  There was one lymph nodes from near the axillary tail of spence  that was removed with the right-sided specimen.   DESCRIPTION OF PROCEDURE:  The patient was taken to the operating room and  placed on the table in the supine position.  After an adequate endotracheal  anesthetic was administered, the patient was prepped and draped in the usual  sterile manner exposing both breasts and the chest wall area.   We marked both breast sites out for the mastectomy starting medially about 1  to 2 cm away from the sternum and ovally coming around the nipple areolar  complex area on both sides with the marking pen.  These were used as guides  for our incision which was done with a #10 blade on each side.  The  dissections on each side were identical.  We started with the right side  using a #10 blade to make the superior incision initially followed by the  inferior incision.  We then developed a flap along the anterior chest wall  removing most of the breast tissue.  We took it down to the pectoralis  muscle superiorly and then laterally around the edge of the pectoralis  muscles down to the anterior border of the latissimus dorsi.  Inferiorly, we  came across the costal margin and across the ligamentous structures on the  chest wall bringing it up medially to the pectoralis muscle again.  We then  detached the breast after we had made our superior and inferior flaps from  the pectoralis muscles on the anterior chest wall and the surrounding  structures.  This was done all with electrocautery with hemostasis being  obtained primarily with electrocautery with suture ligatures of 3-0 Vicryl  being used on some perforating vessels through the pectoralis muscle.  We removed the right breast initially, then packed that area with  moisturized lap sponges.  Again, as mentioned previously, the left side was  done identically and then was subsequently irrigated with saline on both  sides.  Fatty tissue floating around in the wound was aspirated away and  after we had adequately irrigated and obtained hemostasis we placed two flat  Jackson-Pratt drains up underneath the superior flap on each side and  secured it in place with 3-0 nylon.  We then closed in two layers of buried  simple stitch of 3-0 Vicryl interrupted stitches throughout the length of  the incision followed by stainless steel staples at the skin.  Betadine  ointment and Xeroform dressings were applied on top of that along with 4 x 4  gauzes.  The Blake drains were placed on suction with minimal drainage being  noted.  All counts were correct.  The patient was then transferred to the  recovery room in stable condition.      Jame    JW/MEDQ  D:  11/04/2004  T:  11/04/2004  Job:  [id number]

## 2011-07-29 LAB — COMPREHENSIVE METABOLIC PANEL
BUN: 14
CO2: 27
Calcium: 9.3
Creatinine, Ser: 0.81
GFR calc non Af Amer: >60
Glucose, Bld: 226 — ABNORMAL HIGH

## 2011-07-29 LAB — CBC
HCT: 42.6
HCT: 38.2
HCT: 39.3
HCT: 41.5
Hemoglobin: 14.5
Hemoglobin: 12.9
Hemoglobin: 13.8
MCHC: 34
MCHC: 33.7
MCV: 87.6
MCV: 89.5
Platelets: 180
Platelets: 198
RBC: 4.87
RBC: 4.27
RDW: 15.7 — ABNORMAL HIGH
WBC: 11.4 — ABNORMAL HIGH
WBC: 11.5 — ABNORMAL HIGH
WBC: 8.6

## 2011-07-29 LAB — BASIC METABOLIC PANEL
BUN: 11
BUN: 12
CO2: 31
CO2: 34 — ABNORMAL HIGH
Calcium: 8.7
Calcium: 8.9
Chloride: 101
Chloride: 102
Creatinine, Ser: 0.67
Creatinine, Ser: 0.71
GFR calc Af Amer: >60
GFR calc Af Amer: >60
GFR calc Af Amer: >60
GFR calc non Af Amer: >60
GFR calc non Af Amer: >60
GFR calc non Af Amer: >60
Glucose, Bld: 141 — ABNORMAL HIGH
Glucose, Bld: 152 — ABNORMAL HIGH
Potassium: 3.4 — ABNORMAL LOW
Potassium: 3.5
Potassium: 3.8
Sodium: 137
Sodium: 137
Sodium: 142

## 2011-07-29 LAB — DIFFERENTIAL
Basophils Absolute: 0
Basophils Absolute: 0
Basophils Relative: 0
Eosinophils Absolute: 0.3
Eosinophils Relative: 3
Lymphocytes Relative: 13
Lymphocytes Relative: 25
Lymphs Abs: 1.6
Lymphs Abs: 2.8
Monocytes Absolute: 1
Monocytes Relative: 8
Neutro Abs: 7.3
Neutrophils Relative %: 81 — ABNORMAL HIGH
Neutrophils Relative %: 64

## 2011-07-29 LAB — PROTIME-INR
INR: 1
INR: 1.1
Prothrombin Time: 13.7
Prothrombin Time: 13.9

## 2011-07-29 LAB — DIGOXIN LEVEL: Digoxin Level: 0.6 — ABNORMAL LOW

## 2011-07-29 LAB — APTT
aPTT: 26
aPTT: 30

## 2013-01-11 LAB — HEMOGLOBIN A1C: Hgb A1c MFr Bld: 6 % (ref 4.0–6.0)

## 2013-01-11 LAB — CBC AND DIFFERENTIAL
HCT: 40 % (ref 36–46)
Hemoglobin: 13.5 g/dL (ref 12.0–16.0)
Platelets: 241 10*3/uL (ref 150–399)
WBC: 6.9 10^3/mL

## 2013-01-11 LAB — HEPATIC FUNCTION PANEL
ALT: 12 U/L (ref 7–35)
AST: 13 U/L (ref 13–35)
Bilirubin, Total: 0.5 mg/dL

## 2013-01-11 LAB — TSH: TSH: 1.46 u[IU]/mL (ref 0.41–5.90)

## 2013-01-11 LAB — BASIC METABOLIC PANEL: Creatinine: 0.8 mg/dL (ref 0.5–1.1)

## 2013-01-30 ENCOUNTER — Non-Acute Institutional Stay (SKILLED_NURSING_FACILITY): Payer: Medicare Other | Admitting: Nurse Practitioner

## 2013-01-30 ENCOUNTER — Encounter: Payer: Self-pay | Admitting: Nurse Practitioner

## 2013-01-30 DIAGNOSIS — R413 Other amnesia: Secondary | ICD-10-CM

## 2013-01-30 DIAGNOSIS — F329 Major depressive disorder, single episode, unspecified: Secondary | ICD-10-CM

## 2013-01-30 DIAGNOSIS — K5900 Constipation, unspecified: Secondary | ICD-10-CM

## 2013-01-30 DIAGNOSIS — I4891 Unspecified atrial fibrillation: Secondary | ICD-10-CM

## 2013-01-30 DIAGNOSIS — E1159 Type 2 diabetes mellitus with other circulatory complications: Secondary | ICD-10-CM | POA: Insufficient documentation

## 2013-01-30 DIAGNOSIS — F3289 Other specified depressive episodes: Secondary | ICD-10-CM | POA: Insufficient documentation

## 2013-01-30 DIAGNOSIS — E785 Hyperlipidemia, unspecified: Secondary | ICD-10-CM | POA: Insufficient documentation

## 2013-01-30 DIAGNOSIS — IMO0002 Reserved for concepts with insufficient information to code with codable children: Secondary | ICD-10-CM | POA: Insufficient documentation

## 2013-01-30 DIAGNOSIS — E039 Hypothyroidism, unspecified: Secondary | ICD-10-CM

## 2013-01-30 DIAGNOSIS — E1165 Type 2 diabetes mellitus with hyperglycemia: Secondary | ICD-10-CM

## 2013-01-30 DIAGNOSIS — E1129 Type 2 diabetes mellitus with other diabetic kidney complication: Secondary | ICD-10-CM

## 2013-01-30 DIAGNOSIS — I10 Essential (primary) hypertension: Secondary | ICD-10-CM

## 2013-01-30 DIAGNOSIS — M810 Age-related osteoporosis without current pathological fracture: Secondary | ICD-10-CM | POA: Insufficient documentation

## 2013-01-30 DIAGNOSIS — N1330 Unspecified hydronephrosis: Secondary | ICD-10-CM | POA: Insufficient documentation

## 2013-01-30 NOTE — Progress Notes (Signed)
Subjective:    Patient ID: Selena Ward, female    DOB: 02-02-1918, 79 y.o.   MRN: [id number]  HPI   244.9-HYPOTHYROIDISM   Synthroid 02/01/12,  TSH 1.464 01/11/13  250.40-DM, COMP RENAL TYPE II The diabetes remains stable.on Amaryl. Hgb A1c 6.0 3/13/1  272.4-HYPERLIPIDEMIA The patient's most recent LDL is at goal.on Atorvastatin 10mg  since  05/09/12 LDL 64 02/21/12  276.8-HYPOKALEMIA  stable, K  4.6 01/11/13  311-DEPRESSIVE DISORDER NEC The depression remains stable. on Elavil 25mg  since 08/01/12 and perphenazine dc'd 08/31/12, started Zyprexa 7.5mg  presently  401.9-HTN UNSPECIFIED  controlled, on Lasix, Enalapril, Hydralazine, Metoprolol. Bun/creat 14/0.77 01/11/13. [id number]/47-82 averag  427.31-AFIB  rate controlled on Dig. Dig level 1.0 07/22/12  564.00-CONSTIPATION The symptoms controlled on Senna routine, MiraLax, Amitiza  591-HYDRONEPHROSIS  right. Asymptomatic.   722.6-DDD  Lower back improved. Lower extremities weakness noted. Takes Fentanyl 61mcg/hr and Tylenol1000mg  tid. LFT wnl 01/11/13  733.01-SENILE OSTEOPOROSIS  stable on Alendronate--the consultant pharmacist recommended alternative therapy due to CrCl<35 mls/min  780.93-MEMORY LOSS  progressing, MMSE 13/30 05/2012,  Namenda started 06/09/12 and held since 07/2012 for increased confusion and agitation and stopped 08/07/12--no change in confusion or behaviors. Behaviors is better controlled with Zyprexa 7.5mg    Review of Systems  Constitutional: Negative for fever, diaphoresis, activity change, appetite change and fatigue.  HENT: Positive for hearing loss. Negative for congestion, rhinorrhea, trouble swallowing, neck pain, neck stiffness and sinus pressure.   Eyes: Negative for pain, discharge, redness and itching.  Respiratory: Negative for cough, choking, chest tightness, shortness of breath and wheezing.   Cardiovascular: Negative for chest pain, palpitations and leg swelling.  Gastrointestinal: Negative for abdominal pain and  constipation.  Genitourinary: Positive for frequency (incontinent of urine). Negative for dysuria, urgency and flank pain.  Musculoskeletal: Positive for back pain, arthralgias and gait problem (w/c for mobility ).  Skin: Negative for rash and wound.  Allergic/Immunologic: Negative.   Neurological: Negative for tremors, weakness, numbness and headaches.  Hematological: Negative.   Psychiatric/Behavioral: Positive for confusion and agitation. Negative for hallucinations and sleep disturbance. The patient is not nervous/anxious.        Objective:   Physical Exam  Constitutional: She is oriented to person, place, and time. She appears well-developed and well-nourished.  HENT:  Head: Normocephalic and atraumatic.  Eyes: Conjunctivae and EOM are normal. Pupils are equal, round, and reactive to light.  Neck: Normal range of motion. Neck supple. No JVD present. No thyromegaly present.  Cardiovascular: Normal rate and regular rhythm.   No murmur heard. Pulmonary/Chest: Effort normal and breath sounds normal. She has no wheezes. She has no rales.  Abdominal: Soft. Bowel sounds are normal. There is no tenderness.  Musculoskeletal: Normal range of motion. She exhibits no edema and no tenderness.  Leaning to her right while in w/c  Lymphadenopathy:    She has no cervical adenopathy.  Neurological: She is alert and oriented to person, place, and time. She has normal reflexes. She displays normal reflexes. No cranial nerve deficit. She exhibits normal muscle tone. Coordination normal.  Skin: No rash noted. No erythema.  Psychiatric: Her mood appears anxious (at times). Her affect is angry, labile and inappropriate. Her speech is not delayed and not slurred. She is agitated and aggressive. She is not actively hallucinating. Thought content is not paranoid and not delusional. Cognition and memory are impaired. She expresses impulsivity and inappropriate judgment. She exhibits abnormal recent memory and  abnormal remote memory.  Assessment & Plan:    . Unspecified hypothyroidism corrected  . Type II or unspecified type diabetes mellitus with renal manifestations, uncontrolled(250.42) controlled  . Other and unspecified hyperlipidemia Continue Atorvastatin  . Depressive disorder, not elsewhere classified stable  . Unspecified essential hypertension Occasionally elevation of SBP--overall her blood pressure is reasonably controlled.   . Atrial fibrillation Rate controlled, continue Dig  . Unspecified constipation stable  . Hydronephrosis Stable.   . Degeneration of intervertebral disc, site unspecified Managing pain with Fentanyl patch and Tylenol  . Senile osteoporosis Risk for falling.   . Memory loss Unable to make informed decisions regarding her finance and personal care.

## 2013-02-12 ENCOUNTER — Encounter: Payer: Self-pay | Admitting: Nurse Practitioner

## 2013-02-20 ENCOUNTER — Non-Acute Institutional Stay (SKILLED_NURSING_FACILITY): Payer: Medicare Other | Admitting: Nurse Practitioner

## 2013-02-20 DIAGNOSIS — I4891 Unspecified atrial fibrillation: Secondary | ICD-10-CM

## 2013-02-20 DIAGNOSIS — K5900 Constipation, unspecified: Secondary | ICD-10-CM

## 2013-02-20 DIAGNOSIS — R413 Other amnesia: Secondary | ICD-10-CM

## 2013-02-20 DIAGNOSIS — E039 Hypothyroidism, unspecified: Secondary | ICD-10-CM

## 2013-02-20 DIAGNOSIS — I10 Essential (primary) hypertension: Secondary | ICD-10-CM

## 2013-02-20 DIAGNOSIS — IMO0002 Reserved for concepts with insufficient information to code with codable children: Secondary | ICD-10-CM

## 2013-02-20 DIAGNOSIS — F329 Major depressive disorder, single episode, unspecified: Secondary | ICD-10-CM

## 2013-02-20 DIAGNOSIS — E1129 Type 2 diabetes mellitus with other diabetic kidney complication: Secondary | ICD-10-CM

## 2013-02-20 DIAGNOSIS — E785 Hyperlipidemia, unspecified: Secondary | ICD-10-CM

## 2013-02-20 DIAGNOSIS — E1165 Type 2 diabetes mellitus with hyperglycemia: Secondary | ICD-10-CM

## 2013-02-20 NOTE — Progress Notes (Signed)
Patient ID: Selena Ward, female   DOB: 1918-10-05, 78 y.o.   MRN: [id number]  Chief Complaint:  Chief Complaint  Patient presents with  . Medical Managment of Chronic Issues     HPI:  Problem List Items Addressed This Visit     ICD-9-CM   Unspecified hypothyroidism - Primary     Takes Levothyroxine , last TSH 1.464 01/11/13    Type II or unspecified type diabetes mellitus with renal manifestations, uncontrolled(250.42)     Managed with Glimepiride 2mg     Other and unspecified hyperlipidemia     Managed with Atorvastatin 10mg      Depressive disorder, not elsewhere classified     Mood is managed with Amitriptyline  25mg     Unspecified essential hypertension     Controlled on Metoprolol 25mg  bid, Hydralazine 10mg  qid, Enalapril 10mg , and Furosemide 20mg      Atrial fibrillation     Heart rate controlled on Dig     Unspecified constipation     Managed with Amitiza and MiraLax daily    Degeneration of intervertebral disc, site unspecified     Pain is well managed with Fentanyl 27mcg/hr and Tylenol 1000mg  bid.     Memory loss     Wanders in her w/c, takes zyprexa for psychosis--no psychotic behaviors. Prn Ativan helps        Review of Systems:  Review of Systems  Constitutional: Negative for fever, chills, weight loss, malaise/fatigue and diaphoresis.  HENT: Positive for hearing loss. Negative for ear pain, congestion, sore throat and neck pain.   Eyes: Negative for pain, discharge and redness.  Respiratory: Negative for cough, shortness of breath and wheezing.   Cardiovascular: Negative for chest pain, palpitations, orthopnea, claudication, leg swelling and PND.  Gastrointestinal: Negative for heartburn, nausea, vomiting, abdominal pain, diarrhea, constipation and blood in stool.  Genitourinary: Positive for frequency (incontinent of bladder). Negative for dysuria, urgency, hematuria and flank pain.  Musculoskeletal: Positive for back pain, joint pain  and falls. Negative for myalgias.  Skin: Negative for itching and rash.  Neurological: Negative for dizziness, sensory change, speech change, focal weakness, seizures, loss of consciousness, weakness and headaches.  Endo/Heme/Allergies: Negative for environmental allergies and polydipsia. Does not bruise/bleed easily.  Psychiatric/Behavioral: Positive for memory loss. Negative for depression and hallucinations. The patient is nervous/anxious. The patient does not have insomnia.      Medications: Patient's Medications   No medications on file     Physical Exam: Physical Exam  Constitutional: She is oriented to person, place, and time. She appears well-developed and well-nourished.  HENT:  Head: Normocephalic and atraumatic.  Eyes: Conjunctivae and EOM are normal. Pupils are equal, round, and reactive to light.  Neck: Normal range of motion. Neck supple. No JVD present. No thyromegaly present.  Cardiovascular: Normal rate and regular rhythm.   No murmur heard. Pulmonary/Chest: Effort normal and breath sounds normal. She has no wheezes. She has no rales.  Abdominal: Soft. Bowel sounds are normal. There is no tenderness.  Musculoskeletal: Normal range of motion. She exhibits no edema and no tenderness.  Leaning to her right while in w/c  Lymphadenopathy:    She has no cervical adenopathy.  Neurological: She is alert and oriented to person, place, and time. She has normal reflexes. She displays normal reflexes. No cranial nerve deficit. She exhibits normal muscle tone. Coordination normal.  Skin: No rash noted. No erythema.  Psychiatric: Her mood appears anxious (at times). Her affect is angry, labile and inappropriate. Her  speech is not delayed and not slurred. She is agitated and aggressive. She is not actively hallucinating. Thought content is not paranoid and not delusional. Cognition and memory are impaired. She expresses impulsivity and inappropriate judgment. She exhibits abnormal  recent memory and abnormal remote memory.     Filed Vitals:   02/20/13 1732  BP: 163/55  Pulse: 80  Temp: 98.3 F (36.8 C)  TempSrc: Tympanic  Resp: 22      Labs reviewed: Basic Metabolic Panel: No results found for this basename: NA, K, CL, CO2, GLUCOSE, BUN, CREATININE, CALCIUM, MG, PHOS, TSH,  in the last 8760 hours  Liver Function Tests: No results found for this basename: AST, ALT, ALKPHOS, BILITOT, PROT, ALBUMIN,  in the last 8760 hours  CBC: No results found for this basename: WBC, NEUTROABS, HGB, HCT, MCV, PLT,  in the last 8760 hours  Anemia Panel: No results found for this basename: IRON, FOLATE, VITAMINB12,  in the last 8760 hours  Significant Diagnostic Results:     Assessment/Plan Unspecified hypothyroidism Takes Levothyroxine , last TSH 1.464 01/11/13  Memory loss Wanders in her w/c, takes zyprexa for psychosis--no psychotic behaviors. Prn Ativan helps   Degeneration of intervertebral disc, site unspecified Pain is well managed with Fentanyl 33mcg/hr and Tylenol 1000mg  bid.   Unspecified constipation Managed with Amitiza and MiraLax daily  Atrial fibrillation Heart rate controlled on Dig   Unspecified essential hypertension Controlled on Metoprolol 25mg  bid, Hydralazine 10mg  qid, Enalapril 10mg , and Furosemide 20mg    Type II or unspecified type diabetes mellitus with renal manifestations, uncontrolled(250.42) Managed with Glimepiride 2mg   Other and unspecified hyperlipidemia Managed with Atorvastatin 10mg    Depressive disorder, not elsewhere classified Mood is managed with Amitriptyline  25mg      Family/ staff Communication: safety  Goals of care: SNF   Labs/tests ordered none

## 2013-02-20 NOTE — Assessment & Plan Note (Addendum)
Wanders in her w/c, takes zyprexa for psychosis--no psychotic behaviors. Prn Ativan helps

## 2013-02-20 NOTE — Assessment & Plan Note (Signed)
Managed with Glimepiride 2mg   

## 2013-02-20 NOTE — Assessment & Plan Note (Signed)
Heart rate controlled on Dig 125mcg        

## 2013-02-20 NOTE — Assessment & Plan Note (Signed)
Managed with Atorvastatin 10mg 

## 2013-02-20 NOTE — Assessment & Plan Note (Signed)
Controlled on Metoprolol 25mg  bid, Hydralazine 10mg  qid, Enalapril 10mg , and Furosemide 20mg 

## 2013-02-20 NOTE — Assessment & Plan Note (Signed)
Takes Levothyroxine 50mcg, last TSH 1.464 01/11/13         

## 2013-02-20 NOTE — Assessment & Plan Note (Signed)
Pain is well managed with Fentanyl 85mcg/hr and Tylenol 1000mg  bid.

## 2013-02-20 NOTE — Assessment & Plan Note (Addendum)
Managed with Amitiza 24mcg and MiraLax daily     

## 2013-02-20 NOTE — Assessment & Plan Note (Signed)
Mood is managed with Amitriptyline  25mg 

## 2013-02-27 ENCOUNTER — Non-Acute Institutional Stay (SKILLED_NURSING_FACILITY): Payer: Medicare Other | Admitting: Nurse Practitioner

## 2013-02-27 DIAGNOSIS — R102 Pelvic and perineal pain: Secondary | ICD-10-CM | POA: Insufficient documentation

## 2013-02-27 DIAGNOSIS — R1030 Lower abdominal pain, unspecified: Secondary | ICD-10-CM

## 2013-02-27 DIAGNOSIS — I4891 Unspecified atrial fibrillation: Secondary | ICD-10-CM

## 2013-02-27 DIAGNOSIS — R109 Unspecified abdominal pain: Secondary | ICD-10-CM

## 2013-02-27 DIAGNOSIS — K5900 Constipation, unspecified: Secondary | ICD-10-CM

## 2013-02-27 DIAGNOSIS — E039 Hypothyroidism, unspecified: Secondary | ICD-10-CM

## 2013-02-27 DIAGNOSIS — I10 Essential (primary) hypertension: Secondary | ICD-10-CM

## 2013-02-27 DIAGNOSIS — R413 Other amnesia: Secondary | ICD-10-CM

## 2013-02-27 NOTE — Assessment & Plan Note (Addendum)
The patient yelling out: Am I having a baby?--tenderness on palpation-stat catheterization for UA and C/S obtained. No guarded or rebound tenderness, BS positive x4. Rectal check showed soft stool on exam finger. Pain was resolved after the patient was assisted to bed

## 2013-02-27 NOTE — Assessment & Plan Note (Signed)
Heart rate controlled on Dig 125mcg        

## 2013-02-27 NOTE — Assessment & Plan Note (Signed)
Controlled on Metoprolol 25mg bid, Hydralazine 10mg qid, Enalapril 10mg, and Furosemide 20mg  

## 2013-02-27 NOTE — Progress Notes (Signed)
Patient ID: Selena Ward, female   DOB: 1918/01/13, 79 y.o.   MRN: [id number]  Chief Complaint:  Chief Complaint  Patient presents with  . Medical Managment of Chronic Issues    suprapubic region pain     HPI:    Problem List Items Addressed This Visit     ICD-9-CM   Unspecified hypothyroidism     Takes Levothyroxine , last TSH 1.464 01/11/13      Unspecified essential hypertension     Controlled on Metoprolol 25mg  bid, Hydralazine 10mg  qid, Enalapril 10mg , and Furosemide 20mg        Atrial fibrillation     Heart rate controlled on Dig       Unspecified constipation     Managed with Amitiza and MiraLax daily      Memory loss     Wanders in her w/c, takes zyprexa for psychosis--no psychotic behaviors. Prn Ativan helps       Suprapubic pain - Primary     The patient yelling out: Am I having a baby?--tenderness on palpation-stat catheterization for UA and C/S obtained. No guarded or rebound tenderness, BS positive x4. Rectal check showed soft stool on exam finger. Pain was resolved after the patient was assisted to bed       Review of Systems:  Review of Systems  Constitutional: Negative for fever, chills, weight loss, malaise/fatigue and diaphoresis.  HENT: Positive for hearing loss. Negative for ear pain, congestion, sore throat and neck pain.   Eyes: Negative for pain, discharge and redness.  Respiratory: Negative for cough, shortness of breath and wheezing.   Cardiovascular: Negative for chest pain, palpitations, orthopnea, claudication, leg swelling and PND.  Gastrointestinal: Positive for abdominal pain (resolved suprabupic region pain). Negative for heartburn, nausea, vomiting, diarrhea, constipation and blood in stool.  Genitourinary: Positive for frequency (incontinent of bladder). Negative for dysuria, urgency, hematuria and flank pain.  Musculoskeletal: Positive for back pain, joint pain and falls. Negative for myalgias.  Skin: Negative  for itching and rash.  Neurological: Negative for dizziness, sensory change, speech change, focal weakness, seizures, loss of consciousness, weakness and headaches.  Endo/Heme/Allergies: Negative for environmental allergies and polydipsia. Does not bruise/bleed easily.  Psychiatric/Behavioral: Positive for memory loss. Negative for depression and hallucinations. The patient is nervous/anxious. The patient does not have insomnia.      Medications: Reviewed at [facility]   Physical Exam: Physical Exam  Constitutional: She is oriented to person, place, and time. She appears well-developed and well-nourished.  HENT:  Head: Normocephalic and atraumatic.  Eyes: Conjunctivae and EOM are normal. Pupils are equal, round, and reactive to light.  Neck: Normal range of motion. Neck supple. No JVD present. No thyromegaly present.  Cardiovascular: Normal rate and regular rhythm.   No murmur heard. Pulmonary/Chest: Effort normal and breath sounds normal. She has no wheezes. She has no rales.  Abdominal: Soft. Bowel sounds are normal. She exhibits no shifting dullness, no distension and no mass. There is tenderness in the suprapubic area. There is no rigidity, no rebound, no guarding, no CVA tenderness and negative Murphy's sign. No hernia.  Multiple external hemorrhoids.   Genitourinary: No vaginal discharge found.  Musculoskeletal: Normal range of motion. She exhibits no edema and no tenderness.  Leaning to her right while in w/c  Lymphadenopathy:    She has no cervical adenopathy.  Neurological: She is alert and oriented to person, place, and time. She has normal reflexes. She displays normal reflexes. No cranial nerve deficit. She exhibits  normal muscle tone. Coordination normal.  Skin: No rash noted. No erythema.  Psychiatric: Her mood appears anxious (at times). Her affect is angry, labile and inappropriate. Her speech is not delayed and not slurred. She is agitated and aggressive. She is not actively  hallucinating. Thought content is not paranoid and not delusional. Cognition and memory are impaired. She expresses impulsivity and inappropriate judgment. She exhibits abnormal recent memory and abnormal remote memory.     Filed Vitals:   02/27/13 1645  BP: 103/55  Pulse: 80  Temp: 97.3 F (36.3 C)  TempSrc: Tympanic  Resp: 22      Labs reviewed: Basic Metabolic Panel:  Recent Labs  16/10/96  NA 140  K 4.6  BUN 14  CREATININE 0.8  TSH 1.46    Liver Function Tests:  Recent Labs  01/11/13  AST 13  ALT 12  ALKPHOS 41    CBC:  Recent Labs  01/11/13  WBC 6.9  HGB 13.5  HCT 40  PLT 241    Anemia Panel: No results found for this basename: IRON, FOLATE, VITAMINB12,  in the last 8760 hours  Significant Diagnostic Results:     Assessment/Plan Suprapubic pain The patient yelling out: Am I having a baby?--tenderness on palpation-stat catheterization for UA and C/S obtained. No guarded or rebound tenderness, BS positive x4. Rectal check showed soft stool on exam finger. Pain was resolved after the patient was assisted to bed  Unspecified hypothyroidism Takes Levothyroxine , last TSH 1.464 01/11/13    Unspecified essential hypertension Controlled on Metoprolol 25mg  bid, Hydralazine 10mg  qid, Enalapril 10mg , and Furosemide 20mg      Atrial fibrillation Heart rate controlled on Dig     Unspecified constipation Managed with Amitiza and MiraLax daily    Memory loss Wanders in her w/c, takes zyprexa for psychosis--no psychotic behaviors. Prn Ativan helps         Family/ staff Communication: observe abd pain.    Goals of care: SNF   Labs/tests ordered Cath UA and C/S

## 2013-02-27 NOTE — Assessment & Plan Note (Signed)
Takes Levothyroxine 50mcg, last TSH 1.464 01/11/13         

## 2013-02-27 NOTE — Assessment & Plan Note (Signed)
Wanders in her w/c, takes zyprexa for psychosis--no psychotic behaviors. Prn Ativan helps  

## 2013-02-27 NOTE — Assessment & Plan Note (Signed)
Managed with Amitiza 24mcg and MiraLax daily     

## 2013-03-02 ENCOUNTER — Non-Acute Institutional Stay (SKILLED_NURSING_FACILITY): Payer: Medicare Other | Admitting: Nurse Practitioner

## 2013-03-02 DIAGNOSIS — R413 Other amnesia: Secondary | ICD-10-CM

## 2013-03-02 DIAGNOSIS — I4891 Unspecified atrial fibrillation: Secondary | ICD-10-CM

## 2013-03-02 DIAGNOSIS — K5900 Constipation, unspecified: Secondary | ICD-10-CM

## 2013-03-02 DIAGNOSIS — I10 Essential (primary) hypertension: Secondary | ICD-10-CM

## 2013-03-02 DIAGNOSIS — E039 Hypothyroidism, unspecified: Secondary | ICD-10-CM

## 2013-03-02 NOTE — Assessment & Plan Note (Signed)
Takes Levothyroxine 50mcg, last TSH 1.464 01/11/13         

## 2013-03-02 NOTE — Assessment & Plan Note (Signed)
Heart rate controlled on Dig 125mcg        

## 2013-03-02 NOTE — Assessment & Plan Note (Signed)
Controlled on Metoprolol 25mg bid, Hydralazine 10mg qid, Enalapril 10mg, and Furosemide 20mg  

## 2013-03-02 NOTE — Assessment & Plan Note (Signed)
Managed with Amitiza and MiraLax daily

## 2013-03-02 NOTE — Progress Notes (Signed)
Patient ID: Selena Ward, female   DOB: 01-21-1918, 79 y.o.   MRN: [id number]  Chief Complaint:  Chief Complaint  Patient presents with  . Medical Managment of Chronic Issues    delusions-I am having a baby     HPI:   Problem List Items Addressed This Visit     ICD-9-CM   Unspecified hypothyroidism - Primary     Takes Levothyroxine , last TSH 1.464 01/11/13        Unspecified essential hypertension     Controlled on Metoprolol 25mg  bid, Hydralazine 10mg  qid, Enalapril 10mg , and Furosemide 20mg          Atrial fibrillation     Heart rate controlled on Dig         Unspecified constipation     Managed with Amitiza and MiraLax daily        Memory loss (Chronic)     Wanders in her w/c, takes zyprexa for psychosis--urine culture pending, yelling out "I am having a baby"--increase Zyprexa to 10mg  for now. Check CBC and BMP          Review of Systems:  Review of Systems  Constitutional: Negative for fever, chills, weight loss, malaise/fatigue and diaphoresis.  HENT: Positive for hearing loss. Negative for ear pain, congestion, sore throat and neck pain.   Eyes: Negative for pain, discharge and redness.  Respiratory: Negative for cough, shortness of breath and wheezing.   Cardiovascular: Negative for chest pain, palpitations, orthopnea, claudication, leg swelling and PND.  Gastrointestinal: Negative for heartburn, nausea, vomiting, abdominal pain (resolved suprabupic region pain), diarrhea, constipation and blood in stool.  Genitourinary: Positive for frequency (incontinent of bladder). Negative for dysuria, urgency, hematuria and flank pain.  Musculoskeletal: Positive for back pain, joint pain and falls. Negative for myalgias.  Skin: Negative for itching and rash.  Neurological: Negative for dizziness, sensory change, speech change, focal weakness, seizures, loss of consciousness, weakness and headaches.  Endo/Heme/Allergies: Negative for  environmental allergies and polydipsia. Does not bruise/bleed easily.  Psychiatric/Behavioral: Positive for memory loss. Negative for depression and hallucinations. The patient is nervous/anxious. The patient does not have insomnia.      Medications: Reviewed at [facility]   Physical Exam: Physical Exam  Constitutional: Selena Ward is oriented to person, place, and time. Selena Ward appears well-developed and well-nourished.  HENT:  Head: Normocephalic and atraumatic.  Eyes: Conjunctivae and EOM are normal. Pupils are equal, round, and reactive to light.  Neck: Normal range of motion. Neck supple. No JVD present. No thyromegaly present.  Cardiovascular: Normal rate and regular rhythm.   No murmur heard. Pulmonary/Chest: Effort normal and breath sounds normal. Selena Ward has no wheezes. Selena Ward has no rales.  Abdominal: Soft. Bowel sounds are normal. Selena Ward exhibits no shifting dullness, no distension and no mass. There is no tenderness. There is no rigidity, no rebound, no guarding, no CVA tenderness and negative Murphy's sign. No hernia.  Multiple external hemorrhoids.   Genitourinary: No vaginal discharge found.  Musculoskeletal: Normal range of motion. Selena Ward exhibits no edema and no tenderness.  Leaning to her right while in w/c  Lymphadenopathy:    Selena Ward has no cervical adenopathy.  Neurological: Selena Ward is alert and oriented to person, place, and time. Selena Ward has normal reflexes. Selena Ward displays normal reflexes. No cranial nerve deficit. Selena Ward exhibits normal muscle tone. Coordination normal.  Skin: No rash noted. No erythema.  Psychiatric: Her mood appears anxious (at times). Her affect is angry, labile and inappropriate. Her speech is not delayed and not  slurred. Selena Ward is agitated and aggressive. Selena Ward is not actively hallucinating. Thought content is delusional. Thought content is not paranoid. Cognition and memory are impaired. Selena Ward expresses impulsivity and inappropriate judgment. Selena Ward exhibits abnormal recent memory and abnormal remote  memory.     Filed Vitals:   03/02/13 1230  BP: 122/58  Pulse: 72  Temp: 98 F (36.7 C)  TempSrc: Tympanic  Resp: 18      Labs reviewed: Basic Metabolic Panel:  Recent Labs  16/10/96  NA 140  K 4.6  BUN 14  CREATININE 0.8  TSH 1.46    Liver Function Tests:  Recent Labs  01/11/13  AST 13  ALT 12  ALKPHOS 41    CBC:  Recent Labs  01/11/13  WBC 6.9  HGB 13.5  HCT 40  PLT 241    Anemia Panel: No results found for this basename: IRON, FOLATE, VITAMINB12,  in the last 8760 hours  Significant Diagnostic Results:     Assessment/Plan Unspecified hypothyroidism Takes Levothyroxine , last TSH 1.464 01/11/13      Atrial fibrillation Heart rate controlled on Dig       Unspecified constipation Managed with Amitiza and MiraLax daily      Unspecified essential hypertension Controlled on Metoprolol 25mg  bid, Hydralazine 10mg  qid, Enalapril 10mg , and Furosemide 20mg        Memory loss Wanders in her w/c, takes zyprexa for psychosis--urine culture pending, yelling out "I am having a baby"--increase Zyprexa to 10mg  for now. Check CBC and BMP        Family/ staff Communication: safety and behaviros.    Goals of care: SNF   Labs/tests ordered CBC BMP

## 2013-03-02 NOTE — Assessment & Plan Note (Signed)
Wanders in her w/c, takes zyprexa for psychosis--urine culture pending, yelling out "I am having a baby"--increase Zyprexa to 10mg  for now. Check CBC and BMP

## 2013-03-05 LAB — BASIC METABOLIC PANEL
Creatinine: 0.8 mg/dL (ref 0.5–1.1)
Potassium: 4.2 mmol/L (ref 3.4–5.3)
Sodium: 140 mmol/L (ref 137–147)

## 2013-03-06 ENCOUNTER — Other Ambulatory Visit: Payer: Self-pay | Admitting: Geriatric Medicine

## 2013-03-06 MED ORDER — FENTANYL 12 MCG/HR TD PT72: 1.0000 | MEDICATED_PATCH | TRANSDERMAL | Status: DC

## 2013-03-20 ENCOUNTER — Non-Acute Institutional Stay (SKILLED_NURSING_FACILITY): Payer: Medicare Other | Admitting: Nurse Practitioner

## 2013-03-20 DIAGNOSIS — R413 Other amnesia: Secondary | ICD-10-CM

## 2013-03-20 DIAGNOSIS — E039 Hypothyroidism, unspecified: Secondary | ICD-10-CM

## 2013-03-20 DIAGNOSIS — W19XXXA Unspecified fall, initial encounter: Secondary | ICD-10-CM

## 2013-03-20 DIAGNOSIS — F329 Major depressive disorder, single episode, unspecified: Secondary | ICD-10-CM

## 2013-03-20 DIAGNOSIS — I10 Essential (primary) hypertension: Secondary | ICD-10-CM

## 2013-03-20 DIAGNOSIS — W19XXXD Unspecified fall, subsequent encounter: Secondary | ICD-10-CM

## 2013-03-20 NOTE — Assessment & Plan Note (Signed)
Wanders in her w/c, takes zyprexa for psychosis--urine culture pending, yelling out "I am having a baby"--better with increased Zyprexa to 10mg since 03/02/13        

## 2013-03-20 NOTE — Assessment & Plan Note (Signed)
Controlled on Metoprolol 25mg  bid, Hydralazine 10mg  qid, increase Enalapril to 20mg , and Furosemide 20mg . Monitor Bp daily.

## 2013-03-20 NOTE — Assessment & Plan Note (Signed)
Mood is managed with Amitriptyline  25mg 

## 2013-03-20 NOTE — Assessment & Plan Note (Signed)
Takes Levothyroxine 50mcg, last TSH 1.464 01/11/13         

## 2013-03-20 NOTE — Assessment & Plan Note (Signed)
Larey Seat 03/17/13 in her room, hitting her head, no bruising or swelling to her head/face. No new focal neurological deficit.

## 2013-03-20 NOTE — Progress Notes (Signed)
Patient ID: Selena Ward, female   DOB: Apr 26, 1918, 79 y.o.   MRN: [id number]  Chief Complaint:  Chief Complaint  Patient presents with  . Medical Managment of Chronic Issues    fell and hitting head 03/17/13     HPI:   Problem List Items Addressed This Visit   Unspecified essential hypertension (Chronic)     Controlled on Metoprolol 25mg  bid, Hydralazine 10mg  qid, increase Enalapril to 20mg , and Furosemide 20mg . Monitor Bp daily.           Unspecified hypothyroidism     Takes Levothyroxine , last TSH 1.464 01/11/13          Depressive disorder, not elsewhere classified     Mood is managed with Amitriptyline  25mg       Memory loss     Wanders in her w/c, takes zyprexa for psychosis--urine culture pending, yelling out "I am having a baby"--better with increased Zyprexa to 10mg  since 03/02/13         Fall - Primary     Fell 03/17/13 in her room, hitting her head, no bruising or swelling to her head/face. No new focal neurological deficit.        Review of Systems:  Review of Systems  Constitutional: Negative for fever, chills, weight loss, malaise/fatigue and diaphoresis.  HENT: Positive for hearing loss. Negative for ear pain, congestion, sore throat and neck pain.   Eyes: Negative for pain, discharge and redness.  Respiratory: Negative for cough, shortness of breath and wheezing.   Cardiovascular: Negative for chest pain, palpitations, orthopnea, claudication, leg swelling and PND.  Gastrointestinal: Negative for heartburn, nausea, vomiting, abdominal pain (resolved suprabupic region pain), diarrhea, constipation and blood in stool.  Genitourinary: Positive for frequency (incontinent of bladder). Negative for dysuria, urgency, hematuria and flank pain.  Musculoskeletal: Positive for back pain, joint pain and falls. Negative for myalgias.  Skin: Negative for itching and rash.  Neurological: Negative for dizziness, sensory change, speech change, focal  weakness, seizures, loss of consciousness, weakness and headaches.  Endo/Heme/Allergies: Negative for environmental allergies and polydipsia. Does not bruise/bleed easily.  Psychiatric/Behavioral: Positive for memory loss. Negative for depression and hallucinations. The patient is nervous/anxious. The patient does not have insomnia.      Medications: Reviewed at [facility]   Physical Exam: Physical Exam  Constitutional: She is oriented to person, place, and time. She appears well-developed and well-nourished.  HENT:  Head: Normocephalic and atraumatic.  Eyes: Conjunctivae and EOM are normal. Pupils are equal, round, and reactive to light.  Neck: Normal range of motion. Neck supple. No JVD present. No thyromegaly present.  Cardiovascular: Normal rate and regular rhythm.   No murmur heard. Pulmonary/Chest: Effort normal and breath sounds normal. She has no wheezes. She has no rales.  Abdominal: Soft. Bowel sounds are normal. She exhibits no shifting dullness, no distension and no mass. There is no tenderness. There is no rigidity, no rebound, no guarding, no CVA tenderness and negative Murphy's sign. No hernia.  Multiple external hemorrhoids.   Genitourinary: No vaginal discharge found.  Musculoskeletal: Normal range of motion. She exhibits no edema and no tenderness.  Leaning to her right while in w/c  Lymphadenopathy:    She has no cervical adenopathy.  Neurological: She is alert and oriented to person, place, and time. She has normal reflexes. She displays normal reflexes. No cranial nerve deficit. She exhibits normal muscle tone. Coordination normal.  Skin: No rash noted. No erythema.  Psychiatric: Her mood appears anxious (at times).  Her affect is angry, labile and inappropriate. Her speech is not delayed and not slurred. She is agitated and aggressive. She is not actively hallucinating. Thought content is delusional. Thought content is not paranoid. Cognition and memory are impaired. She  expresses impulsivity and inappropriate judgment. She exhibits abnormal recent memory and abnormal remote memory.     Filed Vitals:   03/20/13 1332  BP: 162/88  Pulse: 73  Temp: 96.7 F (35.9 C)  TempSrc: Tympanic  Resp: 20      Labs reviewed: Basic Metabolic Panel:  Recent Labs  78/29/56 03/05/13  NA 140 140  K 4.6 4.2  BUN 14 23*  CREATININE 0.8 0.8  TSH 1.46  --     Liver Function Tests:  Recent Labs  01/11/13  AST 13  ALT 12  ALKPHOS 41    CBC:  Recent Labs  01/11/13 03/05/13  WBC 6.9 7.0  HGB 13.5 13.2  HCT 40 38  PLT 241 234    Anemia Panel: No results found for this basename: IRON, FOLATE, VITAMINB12,  in the last 8760 hours  Significant Diagnostic Results:     Assessment/Plan Genia Hotter 03/17/13 in her room, hitting her head, no bruising or swelling to her head/face. No new focal neurological deficit.   Depressive disorder, not elsewhere classified Mood is managed with Amitriptyline  25mg     Memory loss Wanders in her w/c, takes zyprexa for psychosis--urine culture pending, yelling out "I am having a baby"--better with increased Zyprexa to 10mg  since 03/02/13       Unspecified essential hypertension Controlled on Metoprolol 25mg  bid, Hydralazine 10mg  qid, increase Enalapril to 20mg , and Furosemide 20mg . Monitor Bp daily.         Unspecified hypothyroidism Takes Levothyroxine , last TSH 1.464 01/11/13            Family/ staff Communication: intensive supervision for safety.    Goals of care: SNF   Labs/tests ordered none.

## 2013-04-05 ENCOUNTER — Other Ambulatory Visit: Payer: Self-pay | Admitting: *Deleted

## 2013-04-05 MED ORDER — FENTANYL 12 MCG/HR TD PT72: 1.0000 | MEDICATED_PATCH | TRANSDERMAL | Status: DC

## 2013-04-13 ENCOUNTER — Non-Acute Institutional Stay (SKILLED_NURSING_FACILITY): Payer: Medicare Other | Admitting: Nurse Practitioner

## 2013-04-13 DIAGNOSIS — IMO0002 Reserved for concepts with insufficient information to code with codable children: Secondary | ICD-10-CM

## 2013-04-13 DIAGNOSIS — E1165 Type 2 diabetes mellitus with hyperglycemia: Secondary | ICD-10-CM

## 2013-04-13 DIAGNOSIS — K5900 Constipation, unspecified: Secondary | ICD-10-CM

## 2013-04-13 DIAGNOSIS — F329 Major depressive disorder, single episode, unspecified: Secondary | ICD-10-CM

## 2013-04-13 DIAGNOSIS — E1129 Type 2 diabetes mellitus with other diabetic kidney complication: Secondary | ICD-10-CM

## 2013-04-13 DIAGNOSIS — I10 Essential (primary) hypertension: Secondary | ICD-10-CM

## 2013-04-13 DIAGNOSIS — E039 Hypothyroidism, unspecified: Secondary | ICD-10-CM

## 2013-04-13 DIAGNOSIS — I4891 Unspecified atrial fibrillation: Secondary | ICD-10-CM

## 2013-04-13 DIAGNOSIS — K219 Gastro-esophageal reflux disease without esophagitis: Secondary | ICD-10-CM | POA: Insufficient documentation

## 2013-04-13 DIAGNOSIS — F3289 Other specified depressive episodes: Secondary | ICD-10-CM

## 2013-04-13 DIAGNOSIS — Z66 Do not resuscitate: Secondary | ICD-10-CM | POA: Insufficient documentation

## 2013-04-13 DIAGNOSIS — R413 Other amnesia: Secondary | ICD-10-CM

## 2013-04-13 NOTE — Progress Notes (Signed)
Patient ID: Selena Ward, female   DOB: 09/10/1918, 79 y.o.   MRN: [id number]  Chief Complaint:  Chief Complaint  Patient presents with  . Medical Managment of Chronic Issues     HPI:    Problem List Items Addressed This Visit   Atrial fibrillation     Heart rate controlled on Dig           Degeneration of intervertebral disc, site unspecified     Pain is well managed with Fentanyl 20mcg/hr and Tylenol 1000mg  tid      Depressive disorder, not elsewhere classified     Mood is managed with Amitriptyline  25mg  and Zyprexa 10mg        DNR (do not resuscitate) - Primary   GERD (gastroesophageal reflux disease)     Stable on Protonix 40mg  bid.     Memory loss     Michaelyn Barter in her w/c, takes zyprexa for psychosis--urine culture pending, yelling out "I am having a baby"--better with increased Zyprexa to 10mg  since 03/02/13           Type II or unspecified type diabetes mellitus with renal manifestations, uncontrolled(250.42)     Managed with Glimepiride 2mg       Unspecified constipation     Managed with Amitiza and MiraLax daily and Senokot S III daily          Unspecified essential hypertension (Chronic)     Elevated Sbp--asymptomatic,  on Metoprolol 25mg  bid, Hydralazine 10mg  qid, increased Enalapril to 20mg , and Furosemide 20mg . Monitor Bp daily.             Unspecified hypothyroidism     Takes Levothyroxine , last TSH 1.464 01/11/13               Review of Systems:  Review of Systems  Constitutional: Negative for fever, chills, weight loss, malaise/fatigue and diaphoresis.  HENT: Positive for hearing loss. Negative for ear pain, congestion, sore throat and neck pain.   Eyes: Negative for pain, discharge and redness.  Respiratory: Negative for cough, shortness of breath and wheezing.   Cardiovascular: Negative for chest pain, palpitations, orthopnea, claudication, leg swelling and PND.  Gastrointestinal: Negative for  heartburn, nausea, vomiting, abdominal pain (resolved suprabupic region pain), diarrhea, constipation and blood in stool.  Genitourinary: Positive for frequency (incontinent of bladder). Negative for dysuria, urgency, hematuria and flank pain.  Musculoskeletal: Positive for back pain, joint pain and falls. Negative for myalgias.  Skin: Negative for itching and rash.  Neurological: Negative for dizziness, sensory change, speech change, focal weakness, seizures, loss of consciousness, weakness and headaches.  Endo/Heme/Allergies: Negative for environmental allergies and polydipsia. Does not bruise/bleed easily.  Psychiatric/Behavioral: Positive for memory loss. Negative for depression and hallucinations. The patient is nervous/anxious. The patient does not have insomnia.      Medications: Reviewed at [facility]   Physical Exam: Physical Exam  Constitutional: She is oriented to person, place, and time. She appears well-developed and well-nourished.  HENT:  Head: Normocephalic and atraumatic.  Eyes: Conjunctivae and EOM are normal. Pupils are equal, round, and reactive to light.  Neck: Normal range of motion. Neck supple. No JVD present. No thyromegaly present.  Cardiovascular: Normal rate and regular rhythm.   No murmur heard. Pulmonary/Chest: Effort normal and breath sounds normal. She has no wheezes. She has no rales.  Abdominal: Soft. Bowel sounds are normal. She exhibits no shifting dullness, no distension and no mass. There is no tenderness. There is no rigidity, no rebound, no  guarding, no CVA tenderness and negative Murphy's sign. No hernia.  Multiple external hemorrhoids.   Genitourinary: No vaginal discharge found.  Musculoskeletal: Normal range of motion. She exhibits no edema and no tenderness.  Leaning to her right while in w/c  Lymphadenopathy:    She has no cervical adenopathy.  Neurological: She is alert and oriented to person, place, and time. She has normal reflexes. She  displays normal reflexes. No cranial nerve deficit. She exhibits normal muscle tone. Coordination normal.  Skin: No rash noted. No erythema.  Psychiatric: Her mood appears anxious (at times). Her affect is angry, labile and inappropriate. Her speech is not delayed and not slurred. She is agitated and aggressive. She is not actively hallucinating. Thought content is delusional. Thought content is not paranoid. Cognition and memory are impaired. She expresses impulsivity and inappropriate judgment. She exhibits abnormal recent memory and abnormal remote memory.     Filed Vitals:   04/13/13 1720  BP: 178/84  Pulse: 74  Temp: 97.5 F (36.4 C)  TempSrc: Tympanic  Resp: 20      Labs reviewed: Basic Metabolic Panel:  Recent Labs  40/98/11 03/05/13  NA 140 140  K 4.6 4.2  BUN 14 23*  CREATININE 0.8 0.8  TSH 1.46  --     Liver Function Tests:  Recent Labs  01/11/13  AST 13  ALT 12  ALKPHOS 41    CBC:  Recent Labs  01/11/13 03/05/13  WBC 6.9 7.0  HGB 13.5 13.2  HCT 40 38  PLT 241 234    Anemia Panel: No results found for this basename: IRON, FOLATE, VITAMINB12,  in the last 8760 hours  Significant Diagnostic Results:     Assessment/Plan Unspecified essential hypertension Elevated Sbp--asymptomatic,  on Metoprolol 25mg  bid, Hydralazine 10mg  qid, increased Enalapril to 20mg , and Furosemide 20mg . Monitor Bp daily.           Unspecified hypothyroidism Takes Levothyroxine , last TSH 1.464 01/11/13          Type II or unspecified type diabetes mellitus with renal manifestations, uncontrolled(250.42) Managed with Glimepiride 2mg     Degeneration of intervertebral disc, site unspecified Pain is well managed with Fentanyl 89mcg/hr and Tylenol 1000mg  tid    Unspecified constipation Managed with Amitiza and MiraLax daily and Senokot S III daily        GERD (gastroesophageal reflux disease) Stable on Protonix 40mg  bid.    Depressive disorder, not elsewhere classified Mood is managed with Amitriptyline  25mg  and Zyprexa 10mg      Atrial fibrillation Heart rate controlled on Dig         Memory loss Wanders in her w/c, takes zyprexa for psychosis--urine culture pending, yelling out "I am having a baby"--better with increased Zyprexa to 10mg  since 03/02/13            Family/ staff Communication: observe the patient   Goals of care: SNF   Labs/tests ordered none

## 2013-04-13 NOTE — Assessment & Plan Note (Signed)
Managed with Amitiza and MiraLax daily and Senokot S III daily

## 2013-04-13 NOTE — Assessment & Plan Note (Signed)
Stable on Protonix 40mg bid.   

## 2013-04-13 NOTE — Assessment & Plan Note (Signed)
Elevated Sbp--asymptomatic,  on Metoprolol 25mg  bid, Hydralazine 10mg  qid, increased Enalapril to 20mg , and Furosemide 20mg . Monitor Bp daily.

## 2013-04-13 NOTE — Assessment & Plan Note (Signed)
Pain is well managed with Fentanyl 84mcg/hr and Tylenol 1000mg  tid

## 2013-04-13 NOTE — Assessment & Plan Note (Signed)
Heart rate controlled on Dig 

## 2013-04-13 NOTE — Assessment & Plan Note (Signed)
Takes Levothyroxine , last TSH 1.464 01/11/13

## 2013-04-13 NOTE — Assessment & Plan Note (Signed)
Managed with Glimepiride 2mg 

## 2013-04-13 NOTE — Assessment & Plan Note (Signed)
Mood is managed with Amitriptyline  25mg  and Zyprexa 10mg 

## 2013-04-13 NOTE — Assessment & Plan Note (Signed)
Wanders in her w/c, takes zyprexa for psychosis--urine culture pending, yelling out "I am having a baby"--better with increased Zyprexa to 10mg  since 03/02/13

## 2013-05-18 ENCOUNTER — Non-Acute Institutional Stay (SKILLED_NURSING_FACILITY): Payer: Medicare Other | Admitting: Nurse Practitioner

## 2013-05-18 DIAGNOSIS — I10 Essential (primary) hypertension: Secondary | ICD-10-CM

## 2013-05-18 DIAGNOSIS — E785 Hyperlipidemia, unspecified: Secondary | ICD-10-CM

## 2013-05-18 DIAGNOSIS — M810 Age-related osteoporosis without current pathological fracture: Secondary | ICD-10-CM

## 2013-05-18 DIAGNOSIS — F329 Major depressive disorder, single episode, unspecified: Secondary | ICD-10-CM

## 2013-05-18 DIAGNOSIS — F3289 Other specified depressive episodes: Secondary | ICD-10-CM

## 2013-05-18 DIAGNOSIS — IMO0002 Reserved for concepts with insufficient information to code with codable children: Secondary | ICD-10-CM

## 2013-05-18 NOTE — Assessment & Plan Note (Signed)
Mood is managed with Amitriptyline  25mg  and Zyprexa 10mg 

## 2013-05-18 NOTE — Progress Notes (Signed)
Patient ID: Selena Ward, female   DOB: 1918/02/09, 79 y.o.   MRN: [id number]   Chief Complaint:  Chief Complaint  Patient presents with  . Medical Managment of Chronic Issues    simplify medicaion      HPI:   Problem List Items Addressed This Visit   Degeneration of intervertebral disc, site unspecified - Primary     Pain is well managed, mainly in her back while she was ambulating--no further complaint after she became w/c dependent--will dc Fentanyl 31mcg/hr and continue with Tylenol 1000mg  tid        Depressive disorder, not elsewhere classified     Mood is managed with Amitriptyline  25mg  and Zyprexa 10mg          Other and unspecified hyperlipidemia     Will dc Atorvastatin-simplify meds-may repeat lipid panel in 3 months if her POA desires.     Senile osteoporosis     Risk is lower since she is w/c dependent--dc Calcitonin and Calcium with D.    Unspecified essential hypertension (Chronic)     Elevated Sbp mildly in 140s--asymptomatic,  on Metoprolol 25mg  bid, Hydralazine 10mg  qid, Enalapril to 20mg , and Furosemide 20mg . Monitor Bp daily.                  Review of Systems:  Review of Systems  Constitutional: Negative for fever, chills, weight loss, malaise/fatigue and diaphoresis.  HENT: Positive for hearing loss. Negative for ear pain, congestion, sore throat and neck pain.   Eyes: Negative for pain, discharge and redness.  Respiratory: Negative for cough, shortness of breath and wheezing.   Cardiovascular: Negative for chest pain, palpitations, orthopnea, claudication, leg swelling and PND.  Gastrointestinal: Negative for heartburn, nausea, vomiting, abdominal pain (resolved suprabupic region pain), diarrhea, constipation and blood in stool.  Genitourinary: Positive for frequency (incontinent of bladder). Negative for dysuria, urgency, hematuria and flank pain.  Musculoskeletal: Negative for myalgias, back pain, joint pain and falls.       Leaning  to her right   Skin: Negative for itching and rash.  Neurological: Negative for dizziness, sensory change, speech change, focal weakness, seizures, loss of consciousness, weakness and headaches.  Endo/Heme/Allergies: Negative for environmental allergies and polydipsia. Does not bruise/bleed easily.  Psychiatric/Behavioral: Positive for memory loss. Negative for depression and hallucinations. The patient is nervous/anxious. The patient does not have insomnia.      Medications: Reviewed at [facility]   Physical Exam: Physical Exam  Constitutional: She is oriented to person, place, and time. She appears well-developed and well-nourished.  HENT:  Head: Normocephalic and atraumatic.  Eyes: Conjunctivae and EOM are normal. Pupils are equal, round, and reactive to light.  Neck: Normal range of motion. Neck supple. No JVD present. No thyromegaly present.  Cardiovascular: Normal rate and regular rhythm.   No murmur heard. Pulmonary/Chest: Effort normal and breath sounds normal. She has no wheezes. She has no rales.  Abdominal: Soft. Bowel sounds are normal. She exhibits no shifting dullness, no distension and no mass. There is no tenderness. There is no rigidity, no rebound, no guarding, no CVA tenderness and negative Murphy's sign. No hernia.  Multiple external hemorrhoids.   Genitourinary: No vaginal discharge found.  Musculoskeletal: Normal range of motion. She exhibits no edema and no tenderness.  Leaning to her right while in w/c  Lymphadenopathy:    She has no cervical adenopathy.  Neurological: She is alert and oriented to person, place, and time. She has normal reflexes. She displays normal reflexes.  No cranial nerve deficit. She exhibits normal muscle tone. Coordination normal.  Skin: No rash noted. No erythema.  Psychiatric: Her mood appears anxious (at times). Her affect is angry, labile and inappropriate. Her speech is not delayed and not slurred. She is agitated and aggressive. She is not  actively hallucinating. Thought content is delusional. Thought content is not paranoid. Cognition and memory are impaired. She expresses impulsivity and inappropriate judgment. She exhibits abnormal recent memory and abnormal remote memory.     Filed Vitals:   05/18/13 1021  BP: 140/64  Pulse: 72  Temp: 98.1 F (36.7 C)  TempSrc: Tympanic  Resp: 16      Labs reviewed: Basic Metabolic Panel:  Recent Labs  16/10/96 03/05/13  NA 140 140  K 4.6 4.2  BUN 14 23*  CREATININE 0.8 0.8  TSH 1.46  --     Liver Function Tests:  Recent Labs  01/11/13  AST 13  ALT 12  ALKPHOS 41    CBC:  Recent Labs  01/11/13 03/05/13  WBC 6.9 7.0  HGB 13.5 13.2  HCT 40 38  PLT 241 234         Assessment/Plan Degeneration of intervertebral disc, site unspecified Pain is well managed, mainly in her back while she was ambulating--no further complaint after she became w/c dependent--will dc Fentanyl 79mcg/hr and continue with Tylenol 1000mg  tid      Senile osteoporosis Risk is lower since she is w/c dependent--dc Calcitonin and Calcium with D.  Unspecified essential hypertension Elevated Sbp mildly in 140s--asymptomatic,  on Metoprolol 25mg  bid, Hydralazine 10mg  qid, Enalapril to 20mg , and Furosemide 20mg . Monitor Bp daily.             Other and unspecified hyperlipidemia Will dc Atorvastatin-simplify meds-may repeat lipid panel in 3 months if her POA desires.   Depressive disorder, not elsewhere classified Mood is managed with Amitriptyline  25mg  and Zyprexa 10mg            Family/ staff Communication: observe the patient.    Goals of care: SNF   Labs/tests ordered none

## 2013-05-18 NOTE — Assessment & Plan Note (Addendum)
Elevated Sbp mildly in 140s--asymptomatic,  on Metoprolol 25mg  bid, Hydralazine 10mg  qid, Enalapril to 20mg , and Furosemide 20mg . Monitor Bp daily.

## 2013-05-18 NOTE — Assessment & Plan Note (Signed)
Pain is well managed, mainly in her back while she was ambulating--no further complaint after she became w/c dependent--will dc Fentanyl 57mcg/hr and continue with Tylenol 1000mg  tid

## 2013-05-18 NOTE — Assessment & Plan Note (Signed)
Risk is lower since she is w/c dependent--dc Calcitonin and Calcium with D.

## 2013-05-18 NOTE — Assessment & Plan Note (Signed)
Will dc Atorvastatin-simplify meds-may repeat lipid panel in 3 months if her POA desires.

## 2013-05-29 ENCOUNTER — Other Ambulatory Visit: Payer: Self-pay | Admitting: Geriatric Medicine

## 2013-05-29 MED ORDER — LORAZEPAM 0.5 MG PO TABS: ORAL_TABLET | ORAL | Status: DC

## 2013-06-12 ENCOUNTER — Non-Acute Institutional Stay (SKILLED_NURSING_FACILITY): Payer: Medicare Other | Admitting: Nurse Practitioner

## 2013-06-12 ENCOUNTER — Encounter: Payer: Self-pay | Admitting: Nurse Practitioner

## 2013-06-12 DIAGNOSIS — F329 Major depressive disorder, single episode, unspecified: Secondary | ICD-10-CM

## 2013-06-12 DIAGNOSIS — IMO0002 Reserved for concepts with insufficient information to code with codable children: Secondary | ICD-10-CM

## 2013-06-12 DIAGNOSIS — K219 Gastro-esophageal reflux disease without esophagitis: Secondary | ICD-10-CM

## 2013-06-12 DIAGNOSIS — E1165 Type 2 diabetes mellitus with hyperglycemia: Secondary | ICD-10-CM

## 2013-06-12 DIAGNOSIS — K5900 Constipation, unspecified: Secondary | ICD-10-CM

## 2013-06-12 DIAGNOSIS — E1129 Type 2 diabetes mellitus with other diabetic kidney complication: Secondary | ICD-10-CM

## 2013-06-12 DIAGNOSIS — I10 Essential (primary) hypertension: Secondary | ICD-10-CM

## 2013-06-12 DIAGNOSIS — E039 Hypothyroidism, unspecified: Secondary | ICD-10-CM

## 2013-06-12 DIAGNOSIS — I4891 Unspecified atrial fibrillation: Secondary | ICD-10-CM

## 2013-06-12 NOTE — Progress Notes (Signed)
Patient ID: Selena Ward, female   DOB: 1918/10/17, 79 y.o.   MRN: [id number] Code Status: DNR  Allergies  Allergen Reactions  . Niacin And Related   . Nsaids     Chief Complaint  Patient presents with  . Medical Managment of Chronic Issues    light head pain 06/12/13, found on floor 06/08/13    HPI: Patient is a 79 y.o. female seen in the SNF at [facility] today for evaluation of staff reported light head pain--which was not present upon my visit and other chronic medical conditions.   Problem List Items Addressed This Visit   Atrial fibrillation     Heart rate controlled on Dig , update dig level.             Degeneration of intervertebral disc, site unspecified     Pain is well managed, mainly in her back while she was ambulating--no further complaint after she became w/c dependent--adequately managed wit Tylenol 1000mg  tid          Depressive disorder, not elsewhere classified     Mood is managed with Amitriptyline  25mg  and Zyprexa 10mg  and Ativan 0.25mg  dialy and prn.           GERD (gastroesophageal reflux disease)     Stable on Protonix 40mg  bid.       Type II or unspecified type diabetes mellitus with renal manifestations, uncontrolled(250.42) - Primary     Managed with Glimepiride 2mg , Hgb A1c 6.0 01/11/13--update Hgb A1c        Unspecified constipation     Managed with Amitiza and MiraLax daily and Senokot S III daily            Unspecified essential hypertension (Chronic)     Elevated Sbp mildly in 140s--asymptomatic,  on Metoprolol 25mg  bid, Hydralazine 10mg  qid, Enalapril 20mg , and Furosemide 20mg . Monitor Bp daily. Update CMP                Unspecified hypothyroidism     Takes Levothyroxine , last TSH 1.464 01/11/13. Update TSH                 Review of Systems:  Review of Systems  Constitutional: Negative for fever, chills, weight loss, malaise/fatigue and diaphoresis.  HENT:  Positive for hearing loss. Negative for ear pain, congestion, sore throat and neck pain.   Eyes: Negative for pain, discharge and redness.  Respiratory: Negative for cough, shortness of breath and wheezing.   Cardiovascular: Negative for chest pain, palpitations, orthopnea, claudication, leg swelling and PND.  Gastrointestinal: Negative for heartburn, nausea, vomiting, abdominal pain (resolved suprabupic region pain), diarrhea, constipation and blood in stool.  Genitourinary: Positive for frequency (incontinent of bladder). Negative for dysuria, urgency, hematuria and flank pain.  Musculoskeletal: Negative for myalgias, back pain, joint pain and falls.       Leaning to her right   Skin: Negative for itching and rash.  Neurological: Negative for dizziness, sensory change, speech change, focal weakness, seizures, loss of consciousness, weakness and headaches.  Endo/Heme/Allergies: Negative for environmental allergies and polydipsia. Does not bruise/bleed easily.  Psychiatric/Behavioral: Positive for memory loss. Negative for depression and hallucinations. The patient is nervous/anxious. The patient does not have insomnia.      Past Medical History  Diagnosis Date  . Depression   . Heart murmur   . Hypertension   . Diabetes mellitus without complication   . CHF (congestive heart failure)   . Anxiety   .  GERD (gastroesophageal reflux disease)      Medications: Reviewed at [facility]   Physical Exam: Physical Exam  Constitutional: She is oriented to person, place, and time. She appears well-developed and well-nourished.  HENT:  Head: Normocephalic and atraumatic.  Eyes: Conjunctivae and EOM are normal. Pupils are equal, round, and reactive to light.  Neck: Normal range of motion. Neck supple. No JVD present. No thyromegaly present.  Cardiovascular: Normal rate and regular rhythm.   Murmur heard.  Systolic murmur is present with a grade of 3/6  Pulmonary/Chest: Effort normal and breath  sounds normal. She has no wheezes. She has no rales.  Abdominal: Soft. Bowel sounds are normal. She exhibits no shifting dullness, no distension and no mass. There is no tenderness. There is no rigidity, no rebound, no guarding, no CVA tenderness and negative Murphy's sign. No hernia.  Multiple external hemorrhoids.   Genitourinary: No vaginal discharge found.  Musculoskeletal: Normal range of motion. She exhibits no edema and no tenderness.  Leaning to her right while in w/c  Lymphadenopathy:    She has no cervical adenopathy.  Neurological: She is alert and oriented to person, place, and time. She has normal reflexes. No cranial nerve deficit. She exhibits normal muscle tone. Coordination normal.  Skin: No rash noted. No erythema.  Psychiatric: Her mood appears anxious (at times). Her affect is angry, labile and inappropriate. Her speech is not delayed and not slurred. She is agitated and aggressive. She is not actively hallucinating. Thought content is delusional. Thought content is not paranoid. Cognition and memory are impaired. She expresses impulsivity and inappropriate judgment. She exhibits abnormal recent memory and abnormal remote memory.    Filed Vitals:   06/12/13 1242  BP: 107/62  Pulse: 65  Temp: 96.6 F (35.9 C)  TempSrc: Tympanic  Resp: 18      Labs reviewed: Basic Metabolic Panel:  Recent Labs  45/40/98 03/05/13  NA 140 140  K 4.6 4.2  BUN 14 23*  CREATININE 0.8 0.8  TSH 1.46  --    Liver Function Tests:  Recent Labs  01/11/13  AST 13  ALT 12  ALKPHOS 41   CBC:  Recent Labs  01/11/13 03/05/13  WBC 6.9 7.0  HGB 13.5 13.2  HCT 40 38  PLT 241 234     Assessment/Plan Type II or unspecified type diabetes mellitus with renal manifestations, uncontrolled(250.42) Managed with Glimepiride 2mg , Hgb A1c 6.0 01/11/13--update Hgb A1c      Atrial fibrillation Heart rate controlled on Dig , update dig level.           Unspecified  essential hypertension Elevated Sbp mildly in 140s--asymptomatic,  on Metoprolol 25mg  bid, Hydralazine 10mg  qid, Enalapril 20mg , and Furosemide 20mg . Monitor Bp daily. Update CMP              Unspecified hypothyroidism Takes Levothyroxine , last TSH 1.464 01/11/13. Update TSH            Depressive disorder, not elsewhere classified Mood is managed with Amitriptyline  25mg  and Zyprexa 10mg  and Ativan 0.25mg  dialy and prn.         Unspecified constipation Managed with Amitiza and MiraLax daily and Senokot S III daily          GERD (gastroesophageal reflux disease) Stable on Protonix 40mg  bid.     Degeneration of intervertebral disc, site unspecified Pain is well managed, mainly in her back while she was ambulating--no further complaint after she became w/c dependent--adequately managed wit Tylenol  1000mg  tid          Family/ Staff Communication: observe  Goals of Care: SNF  Labs/tests ordered: CBC, CMP, TSH, Hgb A1c. Digoxin level.

## 2013-06-12 NOTE — Assessment & Plan Note (Addendum)
Heart rate controlled on Dig , update dig level.

## 2013-06-12 NOTE — Assessment & Plan Note (Signed)
Stable on Protonix 40mg bid.   

## 2013-06-12 NOTE — Assessment & Plan Note (Signed)
Mood is managed with Amitriptyline  25mg and Zyprexa 10mg and Ativan 0.25mg dialy and prn.   

## 2013-06-12 NOTE — Assessment & Plan Note (Signed)
Managed with Glimepiride 2mg , Hgb A1c 6.0 01/11/13--update Hgb A1c

## 2013-06-12 NOTE — Assessment & Plan Note (Addendum)
Elevated Sbp mildly in 140s--asymptomatic,  on Metoprolol 25mg  bid, Hydralazine 10mg  qid, Enalapril 20mg , and Furosemide 20mg . Monitor Bp daily. Update CMP

## 2013-06-12 NOTE — Assessment & Plan Note (Signed)
Pain is well managed, mainly in her back while she was ambulating--no further complaint after she became w/c dependent--adequately managed wit Tylenol 1000mg  tid

## 2013-06-12 NOTE — Assessment & Plan Note (Signed)
Takes Levothyroxine , last TSH 1.464 01/11/13. Update TSH

## 2013-06-12 NOTE — Assessment & Plan Note (Signed)
Managed with Amitiza 24mcg and MiraLax daily and Senokot S III daily       

## 2013-06-14 ENCOUNTER — Encounter: Payer: Self-pay | Admitting: Internal Medicine

## 2013-06-14 LAB — HEPATIC FUNCTION PANEL
ALT: 8 U/L (ref 7–35)
Alkaline Phosphatase: 48 U/L (ref 25–125)
Bilirubin, Total: 0.4 mg/dL

## 2013-06-14 LAB — CBC AND DIFFERENTIAL
HCT: 40 % (ref 36–46)
Hemoglobin: 13.9 g/dL (ref 12.0–16.0)

## 2013-06-14 LAB — HEMOGLOBIN A1C: Hgb A1c MFr Bld: 5.9 % (ref 4.0–6.0)

## 2013-06-14 LAB — BASIC METABOLIC PANEL: Sodium: 137 mmol/L (ref 137–147)

## 2013-06-15 ENCOUNTER — Non-Acute Institutional Stay (SKILLED_NURSING_FACILITY): Payer: Medicare Other | Admitting: Nurse Practitioner

## 2013-06-15 DIAGNOSIS — R413 Other amnesia: Secondary | ICD-10-CM

## 2013-06-15 DIAGNOSIS — E1165 Type 2 diabetes mellitus with hyperglycemia: Secondary | ICD-10-CM

## 2013-06-15 DIAGNOSIS — I4891 Unspecified atrial fibrillation: Secondary | ICD-10-CM

## 2013-06-15 DIAGNOSIS — F329 Major depressive disorder, single episode, unspecified: Secondary | ICD-10-CM

## 2013-06-15 DIAGNOSIS — K5900 Constipation, unspecified: Secondary | ICD-10-CM

## 2013-06-15 DIAGNOSIS — R4182 Altered mental status, unspecified: Secondary | ICD-10-CM | POA: Insufficient documentation

## 2013-06-15 DIAGNOSIS — I10 Essential (primary) hypertension: Secondary | ICD-10-CM

## 2013-06-15 DIAGNOSIS — IMO0002 Reserved for concepts with insufficient information to code with codable children: Secondary | ICD-10-CM

## 2013-06-15 DIAGNOSIS — F3289 Other specified depressive episodes: Secondary | ICD-10-CM

## 2013-06-15 DIAGNOSIS — E1129 Type 2 diabetes mellitus with other diabetic kidney complication: Secondary | ICD-10-CM

## 2013-06-15 DIAGNOSIS — K219 Gastro-esophageal reflux disease without esophagitis: Secondary | ICD-10-CM

## 2013-06-15 DIAGNOSIS — E039 Hypothyroidism, unspecified: Secondary | ICD-10-CM

## 2013-06-15 NOTE — Assessment & Plan Note (Signed)
Managed with Amitiza and MiraLax daily and Senokot S III daily

## 2013-06-15 NOTE — Assessment & Plan Note (Signed)
Stable on Protonix 40mg bid.   

## 2013-06-15 NOTE — Assessment & Plan Note (Signed)
Lethargy and Bp 98/52  Will continue Metoprolol 25mg  bid, dc Hydralazine 10mg  qid, increase Enalapril to 40mg , and Furosemide 20mg  to simplify meds and due to the low Bp today. Monitor Bp daily.

## 2013-06-15 NOTE — Assessment & Plan Note (Signed)
Wanders in her w/c, takes zyprexa for psychosis-- Zyprexa to 10mg  since 03/02/13-occasional agitation has been managed with prn Ativan--escalated in the past a few days, will check UA. May consider Depakote for agitation if no better.

## 2013-06-15 NOTE — Assessment & Plan Note (Signed)
Managed with Glimepiride 2mg, Hgb A1c 6.0 01/11/13--Hgb A1c 5.9 06/14/13                   

## 2013-06-15 NOTE — Assessment & Plan Note (Signed)
Agitation, delirium for the past a few days, now lethargy, CBC, CMP, TSH, Hgb A1c Dig .level 06/14/13 unremarkable, will obtain UA C/S in am.

## 2013-06-15 NOTE — Assessment & Plan Note (Signed)
Mood is managed with Amitriptyline  25mg and Zyprexa 10mg and Ativan 0.25mg dialy and prn.   

## 2013-06-15 NOTE — Assessment & Plan Note (Signed)
Takes Levothyroxine 50mcg,TSH 1.464 01/11/13 and 1.030 06/14/13                             

## 2013-06-15 NOTE — Assessment & Plan Note (Signed)
Heart rate controlled on Dig 125mcg, update dig level 1.0 06/14/13                         

## 2013-06-15 NOTE — Assessment & Plan Note (Signed)
Pain is well managed, mainly in her back while she was ambulating--no further complaint after she became w/c dependent--adequately managed wit Tylenol 1000mg tid       

## 2013-06-15 NOTE — Progress Notes (Signed)
Patient ID: Selena Ward, female   DOB: September 25, 1918, 79 y.o.   MRN: [id number] Code Status: DNR  Allergies  Allergen Reactions  . Niacin And Related   . Nsaids     Chief Complaint  Patient presents with  . Medical Managment of Chronic Issues    lethargy    HPI: Patient is a 79 y.o. female seen in the SNF at [facility] today for evaluation of lethargy, lower bp,  and other chronic medical conditions.  Problem List Items Addressed This Visit   Altered mental status     Agitation, delirium for the past a few days, now lethargy, CBC, CMP, TSH, Hgb A1c Dig .level 06/14/13 unremarkable, will obtain UA C/S in am.     Atrial fibrillation     Heart rate controlled on Dig , update dig level 1.0 06/14/13              Degeneration of intervertebral disc, site unspecified     Pain is well managed, mainly in her back while she was ambulating--no further complaint after she became w/c dependent--adequately managed wit Tylenol 1000mg  tid            Depressive disorder, not elsewhere classified     Mood is managed with Amitriptyline  25mg  and Zyprexa 10mg  and Ativan 0.25mg  dialy and prn.             GERD (gastroesophageal reflux disease)     Stable on Protonix 40mg  bid.         Memory loss     Wanders in her w/c, takes zyprexa for psychosis-- Zyprexa to 10mg  since 03/02/13-occasional agitation has been managed with prn Ativan--escalated in the past a few days, will check UA. May consider Depakote for agitation if no better.              Type II or unspecified type diabetes mellitus with renal manifestations, uncontrolled(250.42)     Managed with Glimepiride 2mg , Hgb A1c 6.0 01/11/13--Hgb A1c 5.9 06/14/13          Unspecified constipation     Managed with Amitiza and MiraLax daily and Senokot S III daily              Unspecified essential hypertension - Primary (Chronic)     Lethargy and Bp 98/52  Will continue Metoprolol  25mg  bid, dc Hydralazine 10mg  qid, increase Enalapril to 40mg , and Furosemide 20mg  to simplify meds and due to the low Bp today. Monitor Bp daily.                   Unspecified hypothyroidism     Takes Levothyroxine 50mcg,TSH 1.464 01/11/13 and 1.030 06/14/13                   Review of Systems:  Review of Systems  Constitutional: Positive for malaise/fatigue. Negative for fever, chills, weight loss and diaphoresis.  HENT: Positive for hearing loss. Negative for ear pain, congestion, sore throat and neck pain.   Eyes: Negative for pain, discharge and redness.  Respiratory: Negative for cough, shortness of breath and wheezing.   Cardiovascular: Negative for chest pain, palpitations, orthopnea, claudication, leg swelling and PND.  Gastrointestinal: Negative for heartburn, nausea, vomiting, abdominal pain (resolved suprabupic region pain), diarrhea, constipation and blood in stool.  Genitourinary: Positive for frequency (incontinent of bladder). Negative for dysuria, urgency, hematuria and flank pain.  Musculoskeletal: Positive for joint pain (allover. ). Negative for myalgias, back pain and falls.  Leaning to her right with RUE weaker the left  Skin: Negative for itching and rash.  Neurological: Negative for dizziness, sensory change, speech change, focal weakness, seizures, loss of consciousness, weakness and headaches.  Endo/Heme/Allergies: Negative for environmental allergies and polydipsia. Does not bruise/bleed easily.  Psychiatric/Behavioral: Positive for memory loss. Negative for depression and hallucinations. The patient is nervous/anxious. The patient does not have insomnia.      Past Medical History  Diagnosis Date  . Depression   . Heart murmur   . Hypertension   . Diabetes mellitus without complication   . CHF (congestive heart failure)   . Anxiety   . GERD (gastroesophageal reflux disease)     Medications: Reviewed at [facility]   Physical  Exam: Physical Exam  Constitutional: She is oriented to person, place, and time. She appears well-developed and well-nourished.  HENT:  Head: Normocephalic and atraumatic.  Eyes: Conjunctivae and EOM are normal. Pupils are equal, round, and reactive to light.  Neck: Normal range of motion. Neck supple. No JVD present. No thyromegaly present.  Cardiovascular: Normal rate and regular rhythm.   Murmur heard.  Systolic murmur is present with a grade of 3/6  Pulmonary/Chest: Effort normal and breath sounds normal. She has no wheezes. She has no rales.  Abdominal: Soft. Bowel sounds are normal. She exhibits no shifting dullness, no distension and no mass. There is no tenderness. There is no rigidity, no rebound, no guarding, no CVA tenderness and negative Murphy's sign. No hernia.  Multiple external hemorrhoids.   Genitourinary: No vaginal discharge found.  Musculoskeletal: Normal range of motion. She exhibits no edema and no tenderness.  Leaning to her right while in w/c. The right hand grip strength 5/5 but weaker than the left.   Lymphadenopathy:    She has no cervical adenopathy.  Neurological: She is alert and oriented to person, place, and time. She has normal reflexes. No cranial nerve deficit. She exhibits normal muscle tone. Coordination normal.  Skin: No rash noted. No erythema.  Psychiatric: Her mood appears anxious (at times). Her affect is angry, labile and inappropriate. Her speech is not delayed and not slurred. She is agitated and aggressive. She is not actively hallucinating. Thought content is delusional. Thought content is not paranoid. Cognition and memory are impaired. She expresses impulsivity and inappropriate judgment. She exhibits abnormal recent memory and abnormal remote memory.    Filed Vitals:   06/15/13 1436  BP: 98/52  Pulse: 64  Temp: 97.5 F (36.4 C)  TempSrc: Tympanic  Resp: 18      Labs reviewed: Basic Metabolic Panel:  Recent Labs  19/14/78  03/05/13 06/14/13  NA 140 140 137  K 4.6 4.2 4.3  BUN 14 23* 16  CREATININE 0.8 0.8 1.0  TSH 1.46  --  1.03   Liver Function Tests:  Recent Labs  01/11/13 06/14/13  AST 13 10*  ALT 12 8  ALKPHOS 41 48       Assessment/Plan Unspecified essential hypertension Lethargy and Bp 98/52  Will continue Metoprolol 25mg  bid, dc Hydralazine 10mg  qid, increase Enalapril to 40mg , and Furosemide 20mg  to simplify meds and due to the low Bp today. Monitor Bp daily.                 Atrial fibrillation Heart rate controlled on Dig , update dig level 1.0 06/14/13            Memory loss Wanders in her w/c, takes zyprexa for psychosis-- Zyprexa to 10mg  since 03/02/13-occasional  agitation has been managed with prn Ativan--escalated in the past a few days, will check UA. May consider Depakote for agitation if no better.            Depressive disorder, not elsewhere classified Mood is managed with Amitriptyline  25mg  and Zyprexa 10mg  and Ativan 0.25mg  dialy and prn.           Type II or unspecified type diabetes mellitus with renal manifestations, uncontrolled(250.42) Managed with Glimepiride 2mg , Hgb A1c 6.0 01/11/13--Hgb A1c 5.9 06/14/13        Unspecified hypothyroidism Takes Levothyroxine 42mcg,TSH 1.464 01/11/13 and 1.030 06/14/13              GERD (gastroesophageal reflux disease) Stable on Protonix 40mg  bid.       Degeneration of intervertebral disc, site unspecified Pain is well managed, mainly in her back while she was ambulating--no further complaint after she became w/c dependent--adequately managed wit Tylenol 1000mg  tid          Altered mental status Agitation, delirium for the past a few days, now lethargy, CBC, CMP, TSH, Hgb A1c Dig .level 06/14/13 unremarkable, will obtain UA C/S in am.   Unspecified constipation Managed with Amitiza and MiraLax daily and Senokot S III  daily              Family/ Staff Communication: observe the patient.   Goals of Care: SNF  Labs/tests ordered: Cath UA and C.S in am.

## 2013-07-06 ENCOUNTER — Encounter: Payer: Self-pay | Admitting: Nurse Practitioner

## 2013-07-06 ENCOUNTER — Non-Acute Institutional Stay (SKILLED_NURSING_FACILITY): Payer: Medicare Other | Admitting: Nurse Practitioner

## 2013-07-06 DIAGNOSIS — E1129 Type 2 diabetes mellitus with other diabetic kidney complication: Secondary | ICD-10-CM

## 2013-07-06 DIAGNOSIS — F329 Major depressive disorder, single episode, unspecified: Secondary | ICD-10-CM

## 2013-07-06 DIAGNOSIS — E039 Hypothyroidism, unspecified: Secondary | ICD-10-CM

## 2013-07-06 DIAGNOSIS — K5900 Constipation, unspecified: Secondary | ICD-10-CM

## 2013-07-06 DIAGNOSIS — I10 Essential (primary) hypertension: Secondary | ICD-10-CM

## 2013-07-06 DIAGNOSIS — I4891 Unspecified atrial fibrillation: Secondary | ICD-10-CM

## 2013-07-06 NOTE — Assessment & Plan Note (Signed)
More alert since her low BP reading resolved with  Metoprolol 25mg  bid, off Hydralazine 10mg  qid, increased Enalapril to 40mg , and Furosemide 20mg 

## 2013-07-06 NOTE — Progress Notes (Signed)
Patient ID: Selena Ward, female   DOB: [birth year], 79 y.o.   MRN: [id number] Code Status: DNR  Allergies  Allergen Reactions  . Niacin And Related   . Nsaids     Chief Complaint  Patient presents with  . Medical Managment of Chronic Issues    HPI: Patient is a 79 y.o. female seen in the SNF at [facility] today for evaluation of her chronic medical conditions.  Problem List Items Addressed This Visit   Atrial fibrillation     Heart rate controlled on Dig , update dig level 1.0 06/14/13                Depressive disorder, not elsewhere classified     Mood is managed with Amitriptyline  25mg  and Zyprexa 10mg  and Ativan 0.25mg  dialy and prn.               Type II or unspecified type diabetes mellitus with renal manifestations, uncontrolled(250.42)     Managed with Glimepiride 2mg , Hgb A1c 6.0 01/11/13--Hgb A1c 5.9 06/14/13            Unspecified constipation     Managed with Amitiza bid and Senokot S III daily                Unspecified essential hypertension - Primary (Chronic)     More alert since her low BP reading resolved with  Metoprolol 25mg  bid, off Hydralazine 10mg  qid, increased Enalapril to 40mg , and Furosemide 20mg                      Unspecified hypothyroidism     Takes Levothyroxine 52mcg,TSH 1.464 01/11/13 and 1.030 06/14/13                     Review of Systems:  Review of Systems  Constitutional: Positive for malaise/fatigue. Negative for fever, chills, weight loss and diaphoresis.  HENT: Positive for hearing loss. Negative for ear pain, congestion, sore throat and neck pain.   Eyes: Negative for pain, discharge and redness.  Respiratory: Negative for cough, shortness of breath and wheezing.   Cardiovascular: Negative for chest pain, palpitations, orthopnea, claudication, leg swelling and PND.  Gastrointestinal: Negative for heartburn, nausea, vomiting, abdominal pain  (resolved suprabupic region pain), diarrhea, constipation and blood in stool.  Genitourinary: Positive for frequency (incontinent of bladder). Negative for dysuria, urgency, hematuria and flank pain.  Musculoskeletal: Positive for joint pain (allover. ). Negative for myalgias, back pain and falls.       Leaning to her right with RUE weaker the left  Skin: Negative for itching and rash.  Neurological: Negative for dizziness, sensory change, speech change, focal weakness, seizures, loss of consciousness, weakness and headaches.  Endo/Heme/Allergies: Negative for environmental allergies and polydipsia. Does not bruise/bleed easily.  Psychiatric/Behavioral: Positive for memory loss. Negative for depression and hallucinations. The patient is nervous/anxious. The patient does not have insomnia.      Past Medical History  Diagnosis Date  . Depression   . Heart murmur   . Hypertension   . Diabetes mellitus without complication   . CHF (congestive heart failure)   . Anxiety   . GERD (gastroesophageal reflux disease)     Medications: Reviewed at [facility]   Physical Exam: Physical Exam  Constitutional: She is oriented to person, place, and time. She appears well-developed and well-nourished.  HENT:  Head: Normocephalic and atraumatic.  Eyes: Conjunctivae and EOM are normal. Pupils are equal, round,  and reactive to light.  Neck: Normal range of motion. Neck supple. No JVD present. No thyromegaly present.  Cardiovascular: Normal rate and regular rhythm.   Murmur heard.  Systolic murmur is present with a grade of 3/6  Pulmonary/Chest: Effort normal and breath sounds normal. She has no wheezes. She has no rales.  Abdominal: Soft. Bowel sounds are normal. She exhibits no shifting dullness, no distension and no mass. There is no tenderness. There is no rigidity, no rebound, no guarding, no CVA tenderness and negative Murphy's sign. No hernia.  Multiple external hemorrhoids.   Genitourinary: No  vaginal discharge found.  Musculoskeletal: Normal range of motion. She exhibits no edema and no tenderness.  Leaning to her right while in w/c. The right hand grip strength 5/5 but weaker than the left.   Lymphadenopathy:    She has no cervical adenopathy.  Neurological: She is alert and oriented to person, place, and time. She has normal reflexes. No cranial nerve deficit. She exhibits normal muscle tone. Coordination normal.  Skin: No rash noted. No erythema.  Psychiatric: Her mood appears anxious (at times). Her affect is angry, labile and inappropriate. Her speech is not delayed and not slurred. She is agitated and aggressive. She is not actively hallucinating. Thought content is delusional. Thought content is not paranoid. Cognition and memory are impaired. She expresses impulsivity and inappropriate judgment. She exhibits abnormal recent memory and abnormal remote memory.    Filed Vitals:   07/06/13 1438  BP: 138/66  Pulse: 76  Temp: 98.6 F (37 C)  TempSrc: Tympanic  Resp: 20      Labs reviewed: Basic Metabolic Panel:  Recent Labs  16/10/96 03/05/13 06/14/13  NA 140 140 137  K 4.6 4.2 4.3  BUN 14 23* 16  CREATININE 0.8 0.8 1.0  TSH 1.46  --  1.03   Liver Function Tests:  Recent Labs  01/11/13 06/14/13  AST 13 10*  ALT 12 8  ALKPHOS 41 48       Assessment/Plan Unspecified essential hypertension More alert since her low BP reading resolved with  Metoprolol 25mg  bid, off Hydralazine 10mg  qid, increased Enalapril to 40mg , and Furosemide 20mg                    Type II or unspecified type diabetes mellitus with renal manifestations, uncontrolled(250.42) Managed with Glimepiride 2mg , Hgb A1c 6.0 01/11/13--Hgb A1c 5.9 06/14/13          Atrial fibrillation Heart rate controlled on Dig , update dig level 1.0 06/14/13              Unspecified hypothyroidism Takes Levothyroxine 25mcg,TSH 1.464 01/11/13 and 1.030  06/14/13                Unspecified constipation Managed with Amitiza bid and Senokot S III daily              Depressive disorder, not elsewhere classified Mood is managed with Amitriptyline  25mg  and Zyprexa 10mg  and Ativan 0.25mg  dialy and prn.               Family/ Staff Communication: observe the patient.   Goals of Care: SNF  Labs/tests ordered: none

## 2013-07-06 NOTE — Assessment & Plan Note (Signed)
Managed with Amitiza 24mcg bid and Senokot S III daily                 

## 2013-07-06 NOTE — Assessment & Plan Note (Signed)
Heart rate controlled on Dig 125mcg, update dig level 1.0 06/14/13                         

## 2013-07-06 NOTE — Assessment & Plan Note (Signed)
Mood is managed with Amitriptyline  25mg and Zyprexa 10mg and Ativan 0.25mg dialy and prn.   

## 2013-07-06 NOTE — Assessment & Plan Note (Signed)
Takes Levothyroxine 50mcg,TSH 1.464 01/11/13 and 1.030 06/14/13                             

## 2013-07-06 NOTE — Assessment & Plan Note (Signed)
Managed with Glimepiride 2mg, Hgb A1c 6.0 01/11/13--Hgb A1c 5.9 06/14/13                   

## 2013-07-22 ENCOUNTER — Emergency Department (HOSPITAL_COMMUNITY)
Admission: EM | Admit: 2013-07-22 | Discharge: 2013-07-22 | Disposition: A | Discharge status: Home / Self Care | Payer: Medicare Other | Attending: Emergency Medicine | Admitting: Emergency Medicine

## 2013-07-22 ENCOUNTER — Encounter (HOSPITAL_COMMUNITY): Payer: Self-pay | Admitting: Emergency Medicine

## 2013-07-22 ENCOUNTER — Emergency Department (HOSPITAL_COMMUNITY): Payer: Medicare Other

## 2013-07-22 DIAGNOSIS — F3289 Other specified depressive episodes: Secondary | ICD-10-CM | POA: Insufficient documentation

## 2013-07-22 DIAGNOSIS — F411 Generalized anxiety disorder: Secondary | ICD-10-CM | POA: Insufficient documentation

## 2013-07-22 DIAGNOSIS — Z7982 Long term (current) use of aspirin: Secondary | ICD-10-CM | POA: Insufficient documentation

## 2013-07-22 DIAGNOSIS — R011 Cardiac murmur, unspecified: Secondary | ICD-10-CM | POA: Insufficient documentation

## 2013-07-22 DIAGNOSIS — Z79899 Other long term (current) drug therapy: Secondary | ICD-10-CM | POA: Insufficient documentation

## 2013-07-22 DIAGNOSIS — E119 Type 2 diabetes mellitus without complications: Secondary | ICD-10-CM | POA: Insufficient documentation

## 2013-07-22 DIAGNOSIS — F329 Major depressive disorder, single episode, unspecified: Secondary | ICD-10-CM | POA: Insufficient documentation

## 2013-07-22 DIAGNOSIS — K219 Gastro-esophageal reflux disease without esophagitis: Secondary | ICD-10-CM | POA: Insufficient documentation

## 2013-07-22 DIAGNOSIS — I509 Heart failure, unspecified: Secondary | ICD-10-CM | POA: Insufficient documentation

## 2013-07-22 DIAGNOSIS — N390 Urinary tract infection, site not specified: Secondary | ICD-10-CM

## 2013-07-22 DIAGNOSIS — I10 Essential (primary) hypertension: Secondary | ICD-10-CM | POA: Insufficient documentation

## 2013-07-22 LAB — CBC WITH DIFFERENTIAL/PLATELET
Eosinophils Absolute: 0 10*3/uL (ref 0.0–0.7)
Eosinophils Relative: 0 % (ref 0–5)
HCT: 41.4 % (ref 36.0–46.0)
Lymphs Abs: 1.1 10*3/uL (ref 0.7–4.0)
MCH: 30.2 pg (ref 26.0–34.0)
MCV: 93.2 fL (ref 78.0–100.0)
Monocytes Absolute: 0.7 10*3/uL (ref 0.1–1.0)
Platelets: 283 10*3/uL (ref 150–400)
RBC: 4.44 MIL/uL (ref 3.87–5.11)
RDW: 14.2 % (ref 11.5–15.5)

## 2013-07-22 LAB — COMPREHENSIVE METABOLIC PANEL
ALT: 10 U/L (ref 0–35)
BUN: 25 mg/dL — ABNORMAL HIGH (ref 6–23)
CO2: 30 mEq/L (ref 19–32)
Calcium: 9.6 mg/dL (ref 8.4–10.5)
Creatinine, Ser: 1.07 mg/dL (ref 0.50–1.10)
GFR calc Af Amer: 50 mL/min — ABNORMAL LOW (ref >90)
GFR calc non Af Amer: 43 mL/min — ABNORMAL LOW (ref >90)
Glucose, Bld: 155 mg/dL — ABNORMAL HIGH (ref 70–99)
Sodium: 140 mEq/L (ref 135–145)
Total Protein: 6.8 g/dL (ref 6.0–8.3)

## 2013-07-22 LAB — URINALYSIS, ROUTINE W REFLEX MICROSCOPIC
Bilirubin Urine: NEGATIVE
Glucose, UA: NEGATIVE mg/dL
Protein, ur: 30 mg/dL — AB
Urobilinogen, UA: 0.2 mg/dL (ref 0.0–1.0)

## 2013-07-22 LAB — URINE MICROSCOPIC-ADD ON

## 2013-07-22 MED ORDER — SODIUM CHLORIDE 0.9 % IV SOLN
INTRAVENOUS | Status: DC
  Administered 2013-07-22: 16:00:00 via INTRAVENOUS

## 2013-07-22 MED ORDER — DEXTROSE 5 % IV SOLN
1.0000 g | Freq: Once | INTRAVENOUS | Status: AC
  Administered 2013-07-22: 1 g via INTRAVENOUS
  Filled 2013-07-22: qty 10

## 2013-07-22 MED ORDER — CEPHALEXIN 500 MG PO CAPS: 500.0000 mg | ORAL_CAPSULE | Freq: Four times a day (QID) | ORAL | Status: DC

## 2013-07-22 NOTE — ED Notes (Signed)
Bed: ZO10 Expected date:  Expected time:  Means of arrival:  Comments: confusion

## 2013-07-22 NOTE — ED Notes (Signed)
[facility] called to take pt back to [facility].

## 2013-07-22 NOTE — Discharge Instructions (Signed)
Urinary Tract Infection  Urinary tract infections (UTIs) can develop anywhere along your urinary tract. Your urinary tract is your body's drainage system for removing wastes and extra water. Your urinary tract includes two kidneys, two ureters, a bladder, and a urethra. Your kidneys are a pair of bean-shaped organs. Each kidney is about the size of your fist. They are located below your ribs, one on each side of your spine.  CAUSES  Infections are caused by microbes, which are microscopic organisms, including fungi, viruses, and bacteria. These organisms are so small that they can only be seen through a microscope. Bacteria are the microbes that most commonly cause UTIs.  SYMPTOMS   Symptoms of UTIs may vary by age and gender of the patient and by the location of the infection. Symptoms in young women typically include a frequent and intense urge to urinate and a painful, burning feeling in the bladder or urethra during urination. Older women and men are more likely to be tired, shaky, and weak and have muscle aches and abdominal pain. A fever may mean the infection is in your kidneys. Other symptoms of a kidney infection include pain in your back or sides below the ribs, nausea, and vomiting.  DIAGNOSIS  To diagnose a UTI, your caregiver will ask you about your symptoms. Your caregiver also will ask to provide a urine sample. The urine sample will be tested for bacteria and white blood cells. White blood cells are made by your body to help fight infection.  TREATMENT   Typically, UTIs can be treated with medication. Because most UTIs are caused by a bacterial infection, they usually can be treated with the use of antibiotics. The choice of antibiotic and length of treatment depend on your symptoms and the type of bacteria causing your infection.  HOME CARE INSTRUCTIONS   If you were prescribed antibiotics, take them exactly as your caregiver instructs you. Finish the medication even if you feel better after you  have only taken some of the medication.   Drink enough water and fluids to keep your urine clear or pale yellow.   Avoid caffeine, tea, and carbonated beverages. They tend to irritate your bladder.   Empty your bladder often. Avoid holding urine for long periods of time.   Empty your bladder before and after sexual intercourse.   After a bowel movement, women should cleanse from front to back. Use each tissue only once.  SEEK MEDICAL CARE IF:    You have back pain.   You develop a fever.   Your symptoms do not begin to resolve within 3 days.  SEEK IMMEDIATE MEDICAL CARE IF:    You have severe back pain or lower abdominal pain.   You develop chills.   You have nausea or vomiting.   You have continued burning or discomfort with urination.  MAKE SURE YOU:    Understand these instructions.   Will watch your condition.   Will get help right away if you are not doing well or get worse.  Document Released: 07/28/2005 Document Revised: 04/18/2012 Document Reviewed: 11/26/2011  [employer] Patient Information 2014 [employer].

## 2013-07-22 NOTE — ED Provider Notes (Signed)
CSN: [id number]     Arrival date & time 07/22/13  1451 History   First MD Initiated Contact with Patient 07/22/13 1529     Chief Complaint  Patient presents with  . Altered Mental Status   (Consider location/radiation/quality/duration/timing/severity/associated sxs/prior Treatment) Patient is a 79 y.o. female presenting with altered mental status.  Altered Mental Status  Level 5 caveat due to AMS Pt brought by EMS from SNF for reported change in mental status. She was also reportedly 'warm' but no documented fever. No further history available. Pt reports she just doesn't feel good.   Past Medical History  Diagnosis Date  . Depression   . Heart murmur   . Hypertension   . Diabetes mellitus without complication   . CHF (congestive heart failure)   . Anxiety   . GERD (gastroesophageal reflux disease)    History reviewed. No pertinent past surgical history. No family history on file. History  Substance Use Topics  . Smoking status: Not on file  . Smokeless tobacco: Not on file  . Alcohol Use: Not on file   OB History   Grav Para Term Preterm Abortions TAB SAB Ect Mult Living                 Review of Systems Unable to assess due to mental status.    Allergies  Niacin and related and Nsaids  Home Medications   Current Outpatient Rx  Name  Route  Sig  Dispense  Refill  . acetaminophen (TYLENOL) 500 MG tablet   Oral   Take 1,000 mg by mouth every 8 (eight) hours as needed for pain.         Marland Kitchen amitriptyline (ELAVIL) 25 MG tablet   Oral   Take 25 mg by mouth at bedtime.         Marland Kitchen aspirin 81 MG chewable tablet   Oral   Chew 81 mg by mouth daily.         . digoxin (LANOXIN) 0.125 MG tablet   Oral   Take 0.125 mg by mouth daily. Hold for pulse >60         . enalapril (VASOTEC) 20 MG tablet   Oral   Take 40 mg by mouth daily.         . furosemide (LASIX) 20 MG tablet   Oral   Take 20 mg by mouth.         Marland Kitchen glimepiride (AMARYL) 2 MG tablet    Oral   Take 2 mg by mouth daily before breakfast.         . lactose free nutrition (BOOST) LIQD   Oral   Take 237 mLs by mouth 2 (two) times daily between meals. vanilla         . levothyroxine (SYNTHROID, LEVOTHROID) 50 MCG tablet   Oral   Take 50 mcg by mouth daily before breakfast.         . LORazepam (ATIVAN) 0.5 MG tablet      Take 1/2 tablet by mouth every day at 1pm.   15 tablet   5   . lubiprostone (AMITIZA) 24 MCG capsule   Oral   Take 24 mcg by mouth 2 (two) times daily with a meal.         . metoprolol tartrate (LOPRESSOR) 25 MG tablet   Oral   Take 25 mg by mouth 2 (two) times daily.         Marland Kitchen OLANZapine (ZYPREXA) 10 MG tablet  Oral   Take 10 mg by mouth at bedtime.         . pantoprazole (PROTONIX) 40 MG tablet   Oral   Take 40 mg by mouth 2 (two) times daily.         . polyethylene glycol (MIRALAX / GLYCOLAX) packet   Oral   Take 17 g by mouth daily.         Marland Kitchen PRESCRIPTION MEDICATION   Topical   Apply 1 mL topically every 6 (six) hours as needed (for agitation). Lorazepam 1mg /ml gel         . sennosides-docusate sodium (SENOKOT-S) 8.6-50 MG tablet   Oral   Take 1 tablet by mouth daily.          BP 149/92  Pulse 80  Temp(Src) 98.1 F (36.7 C) (Rectal)  Resp 16  SpO2 94% Physical Exam  Nursing note and vitals reviewed. Constitutional: She appears well-developed and well-nourished.  HENT:  Head: Normocephalic and atraumatic.  Dry mouth  Eyes: EOM are normal. Pupils are equal, round, and reactive to light.  Neck: Normal range of motion. Neck supple.  Cardiovascular: Normal rate, normal heart sounds and intact distal pulses.   Pulmonary/Chest: Effort normal and breath sounds normal.  Abdominal: Bowel sounds are normal. She exhibits no distension. There is no tenderness.  Musculoskeletal: Normal range of motion. She exhibits no edema and no tenderness.  Neurological: She is alert. She has normal strength. No cranial nerve  deficit or sensory deficit.  Skin: Skin is warm and dry. No rash noted.  Psychiatric: She has a normal mood and affect.    ED Course  Procedures (including critical care time) Labs Review Labs Reviewed  URINALYSIS, ROUTINE W REFLEX MICROSCOPIC - Abnormal; Notable for the following:    APPearance CLOUDY (*)    Hgb urine dipstick MODERATE (*)    Protein, ur 30 (*)    Leukocytes, UA LARGE (*)    All other components within normal limits  CBC WITH DIFFERENTIAL - Abnormal; Notable for the following:    WBC 11.6 (*)    Neutrophils Relative % 84 (*)    Neutro Abs 9.8 (*)    Lymphocytes Relative 9 (*)    All other components within normal limits  COMPREHENSIVE METABOLIC PANEL - Abnormal; Notable for the following:    Glucose, Bld 155 (*)    BUN 25 (*)    Albumin 3.4 (*)    GFR calc non Af Amer 43 (*)    GFR calc Af Amer 50 (*)    All other components within normal limits  URINE MICROSCOPIC-ADD ON - Abnormal; Notable for the following:    Bacteria, UA MANY (*)    All other components within normal limits  URINE CULTURE  TROPONIN I   Imaging Review Dg Chest 2 View  07/22/2013   CLINICAL DATA:  Altered mental status; hypertension  EXAM: CHEST  2 VIEW  COMPARISON:  April 27, 2004  FINDINGS: There is elevation of the right hemidiaphragm. There is no edema or consolidation. Heart is upper normal in size with normal pulmonary vascularity. Patient is status post coronary artery bypass grafting. There is no adenopathy. There is a healing fracture of the proximal right humeral metaphysis.  IMPRESSION: No edema or consolidation. Elevation of right hemidiaphragm, a chronic finding. Healing fracture proximal right humerus.   Electronically Signed   By: Bretta Bang   On: 07/22/2013 16:57   Ct Head Wo Contrast  07/22/2013   CLINICAL DATA:  Increased confusion  EXAM: CT HEAD WITHOUT CONTRAST  TECHNIQUE: Contiguous axial images were obtained from the base of the skull through the vertex without  intravenous contrast. Study was obtained within 24 hr of patient's arrival at the emergency department.  COMPARISON:  None.  FINDINGS: There is moderate diffuse atrophy. There is no mass, hemorrhage, extra-axial fluid collection, or midline shift. There is small vessel disease in the centra semiovale bilaterally. There is decreased attenuation throughout much of the right centrum semiovale which appears asymmetric compared to the left. This degree of asymmetry could be indicative of recent infarct in the right centrum semiovale. There is no decreased attenuation extending into the gray matter in this area, however. 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  Bony calvarium appears intact. The mastoid air cells are clear.  IMPRESSION: Atrophy was supratentorial small vessel disease. There is decreased attenuation throughout much of the right centrum semiovale compared with the left. This asymmetry raises question of acute infarct involving this territory. However, note that there is no loss of attenuation in the gray matter. It is possible that these changes are entirely chronic although asymmetric.  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  There is no hemorrhage or mass effect.   Electronically Signed   By: Bretta Bang   On: 07/22/2013 17:21    MDM   1. UTI (urinary tract infection)       Date: 07/22/2013  Rate: 84  Rhythm: normal sinus rhythm  QRS Axis: right  Intervals: normal  ST/T Wave abnormalities: nonspecific ST/T changes  Conduction Disutrbances:right bundle branch block  Narrative Interpretation:   Old EKG Reviewed: unchanged   Spoke with staff at SNF who states patient was 'unresponsive' earlier today. At baseline, she has episodes of agitation controlled with Ativan but otherwise is conversant and mildly confused at baseline  6:01 PM Pt with UTI, given Rocephin, she is otherwise non-toxic awake and alert with normal  vitals. Plan to return to SNF.   Charles B. Bernette Mayers, MD 07/22/13 1027

## 2013-07-22 NOTE — ED Notes (Signed)
[facility] here to take pt back to [facility]. Pt dressed.

## 2013-07-22 NOTE — ED Notes (Signed)
Increase confusion per nursing home warm to touch, denies any c/o. 20g lt ac saline lock, a fib with a hx,

## 2013-07-24 ENCOUNTER — Non-Acute Institutional Stay (SKILLED_NURSING_FACILITY): Payer: Medicare Other | Admitting: Nurse Practitioner

## 2013-07-24 DIAGNOSIS — E1129 Type 2 diabetes mellitus with other diabetic kidney complication: Secondary | ICD-10-CM

## 2013-07-24 DIAGNOSIS — R413 Other amnesia: Secondary | ICD-10-CM

## 2013-07-24 DIAGNOSIS — R4182 Altered mental status, unspecified: Secondary | ICD-10-CM

## 2013-07-24 DIAGNOSIS — K5900 Constipation, unspecified: Secondary | ICD-10-CM

## 2013-07-24 DIAGNOSIS — E039 Hypothyroidism, unspecified: Secondary | ICD-10-CM

## 2013-07-24 DIAGNOSIS — K219 Gastro-esophageal reflux disease without esophagitis: Secondary | ICD-10-CM

## 2013-07-24 DIAGNOSIS — N390 Urinary tract infection, site not specified: Secondary | ICD-10-CM

## 2013-07-24 DIAGNOSIS — I10 Essential (primary) hypertension: Secondary | ICD-10-CM

## 2013-07-24 DIAGNOSIS — F329 Major depressive disorder, single episode, unspecified: Secondary | ICD-10-CM

## 2013-07-24 DIAGNOSIS — I4891 Unspecified atrial fibrillation: Secondary | ICD-10-CM

## 2013-07-24 LAB — URINE CULTURE
Colony Count: NO GROWTH
Culture: NO GROWTH

## 2013-07-24 NOTE — Progress Notes (Signed)
Patient ID: Selena Ward, female   DOB: May 18, 1918, 79 y.o.   MRN: [id number] Code Status: DNR  Allergies  Allergen Reactions  . Niacin And Related   . Nsaids     Chief Complaint  Patient presents with  . Medical Managment of Chronic Issues    UTI  . Hospitalization Follow-up    HPI: Patient is a 79 y.o. female seen in the SNF at [facility] today for evaluation of UTI, s/p unresponsiveness, s/p ER evaluation, and her chronic medical conditions. CT head w/o CM and CXR unremarkable while in ER. Urine culture 07/22/13 showed no growth.      Problem List Items Addressed This Visit   Altered mental status - Primary     Unresponsiveness 07/22/13-transferred to ED subsequently-the patient was found to have UTI--Keflex 500mg  qid x 7 days started 07/22/13. 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 There is no hemorrhage or mass effect. Her mentation has returned to her baseline upon my visit.       Atrial fibrillation     Heart rate controlled on Dig , update dig level 1.0 06/14/13                  Depressive disorder, not elsewhere classified     Mood is managed with Amitriptyline  25mg  and Zyprexa 10mg  and Ativan 0.25mg  dialy and prn.                 GERD (gastroesophageal reflux disease)     Stable on Protonix 40mg  bid.           Memory loss     Michaelyn Barter in her w/c, takes zyprexa for psychosis-- Zyprexa to 10mg  since 03/02/13-occasional agitation has been managed with prn Ativan               Type II or unspecified type diabetes mellitus with renal manifestations, uncontrolled(250.42)     Managed with Glimepiride 2mg , Hgb A1c 6.0 01/11/13--Hgb A1c 5.9 06/14/13              Unspecified constipation     Managed with Amitiza bid and Senokot S III daily                  Unspecified essential hypertension (Chronic)     Controlled on   Metoprolol 25mg   bid and Enalapril 40mg  and Furosemide 20mg                        Unspecified hypothyroidism     Takes Levothyroxine 27mcg,TSH 1.464 01/11/13 and 1.030 06/14/13                    UTI (urinary tract infection)     UTI per hospital-presently on 7 day course of Keflex. Will repeat Cath UA and C/S upon completion of ABT.        Review of Systems:  Review of Systems  Constitutional: Positive for malaise/fatigue. Negative for fever, chills, weight loss and diaphoresis.  HENT: Positive for hearing loss. Negative for ear pain, congestion, sore throat and neck pain.   Eyes: Negative for pain, discharge and redness.  Respiratory: Negative for cough, shortness of breath and wheezing.   Cardiovascular: Negative for chest pain, palpitations, orthopnea, claudication, leg swelling and PND.  Gastrointestinal: Negative for heartburn, nausea, vomiting, abdominal pain (resolved suprabupic region pain), diarrhea, constipation and blood in stool.  Genitourinary: Positive  for frequency (incontinent of bladder). Negative for dysuria, urgency, hematuria and flank pain.  Musculoskeletal: Positive for joint pain (allover. ). Negative for myalgias, back pain and falls.       Leaning to her right with RUE weaker the left  Skin: Negative for itching and rash.  Neurological: Negative for dizziness, sensory change, speech change, focal weakness, seizures, loss of consciousness, weakness and headaches.  Endo/Heme/Allergies: Negative for environmental allergies and polydipsia. Does not bruise/bleed easily.  Psychiatric/Behavioral: Positive for memory loss. Negative for depression and hallucinations. The patient is nervous/anxious. The patient does not have insomnia.      Past Medical History  Diagnosis Date  . Depression   . Heart murmur   . Hypertension   . Diabetes mellitus without complication   . CHF (congestive heart failure)   . Anxiety   . GERD (gastroesophageal reflux  disease)     Medications:   Medication List       This list is accurate as of: 07/24/13 11:59 PM.  Always use your most recent med list.               acetaminophen 500 MG tablet  Commonly known as:  TYLENOL  Take 1,000 mg by mouth every 8 (eight) hours as needed for pain.     amitriptyline 25 MG tablet  Commonly known as:  ELAVIL  Take 25 mg by mouth at bedtime.     aspirin 81 MG chewable tablet  Chew 81 mg by mouth daily.     cephALEXin 500 MG capsule  Commonly known as:  KEFLEX  Take 1 capsule (500 mg total) by mouth 4 (four) times daily.     digoxin 0.125 MG tablet  Commonly known as:  LANOXIN  Take 0.125 mg by mouth daily. Hold for pulse >60     enalapril 20 MG tablet  Commonly known as:  VASOTEC  Take 40 mg by mouth daily.     furosemide 20 MG tablet  Commonly known as:  LASIX  Take 20 mg by mouth.     glimepiride 2 MG tablet  Commonly known as:  AMARYL  Take 2 mg by mouth daily before breakfast.     lactose free nutrition Liqd  Take 237 mLs by mouth 2 (two) times daily between meals. vanilla     levothyroxine 50 MCG tablet  Commonly known as:  SYNTHROID, LEVOTHROID  Take 50 mcg by mouth daily before breakfast.     LORazepam 0.5 MG tablet  Commonly known as:  ATIVAN  Take 1/2 tablet by mouth every day at 1pm.     lubiprostone 24 MCG capsule  Commonly known as:  AMITIZA  Take 24 mcg by mouth 2 (two) times daily with a meal.     metoprolol tartrate 25 MG tablet  Commonly known as:  LOPRESSOR  Take 25 mg by mouth 2 (two) times daily.     OLANZapine 10 MG tablet  Commonly known as:  ZYPREXA  Take 10 mg by mouth at bedtime.     pantoprazole 40 MG tablet  Commonly known as:  PROTONIX  Take 40 mg by mouth 2 (two) times daily.     polyethylene glycol packet  Commonly known as:  MIRALAX / GLYCOLAX  Take 17 g by mouth daily.     PRESCRIPTION MEDICATION  Apply 1 mL topically every 6 (six) hours as needed (for agitation). Lorazepam 1mg /ml gel      sennosides-docusate sodium 8.6-50 MG tablet  Commonly known as:  SENOKOT-S  Take 1 tablet by mouth daily.           Physical Exam: Physical Exam  Constitutional: She is oriented to person, place, and time. She appears well-developed and well-nourished.  HENT:  Head: Normocephalic and atraumatic.  Eyes: Conjunctivae and EOM are normal. Pupils are equal, round, and reactive to light.  Neck: Normal range of motion. Neck supple. No JVD present. No thyromegaly present.  Cardiovascular: Normal rate and regular rhythm.   Murmur heard.  Systolic murmur is present with a grade of 3/6  Pulmonary/Chest: Effort normal and breath sounds normal. She has no wheezes. She has no rales.  Abdominal: Soft. Bowel sounds are normal. She exhibits no shifting dullness, no distension and no mass. There is no tenderness. There is no rigidity, no rebound, no guarding, no CVA tenderness and negative Murphy's sign. No hernia.  Multiple external hemorrhoids.   Genitourinary: No vaginal discharge found.  Musculoskeletal: Normal range of motion. She exhibits no edema and no tenderness.  Leaning to her right while in w/c. The right hand grip strength 5/5 but weaker than the left.   Lymphadenopathy:    She has no cervical adenopathy.  Neurological: She is alert and oriented to person, place, and time. She has normal reflexes. No cranial nerve deficit. She exhibits normal muscle tone. Coordination normal.  Skin: No rash noted. No erythema.  Psychiatric: Her mood appears anxious (at times). Her affect is angry, labile and inappropriate. Her speech is not delayed and not slurred. She is agitated and aggressive. She is not actively hallucinating. Thought content is delusional. Thought content is not paranoid. Cognition and memory are impaired. She expresses impulsivity and inappropriate judgment. She exhibits abnormal recent memory and abnormal remote memory.    Filed Vitals:   07/27/13 1520  BP: 144/76  Pulse: 68   Temp: 97 F (36.1 C)  TempSrc: Tympanic  Resp: 16      Labs reviewed: Basic Metabolic Panel:  Recent Labs  16/10/96 03/05/13 06/14/13 07/22/13 1624  NA 140 140 137 140  K 4.6 4.2 4.3 4.0  CL  --   --   --  100  CO2  --   --   --  30  GLUCOSE  --   --   --  155*  BUN 14 23* 16 25*  CREATININE 0.8 0.8 1.0 1.07  CALCIUM  --   --   --  9.6  TSH 1.46  --  1.03  --    Liver Function Tests:  Recent Labs  01/11/13 06/14/13 07/22/13 1624  AST 13 10* 10  ALT 12 8 10   ALKPHOS 41 48 57  BILITOT  --   --  0.3  PROT  --   --  6.8  ALBUMIN  --   --  3.4*    Past Imaging Review   07/22/2013 CHEST 2 VIEW  IMPRESSION: No edema or consolidation. Elevation of right hemidiaphragm, a chronic finding. Healing fracture proximal right humerus.   07/22/2013 CT HEAD WITHOUT CONTRAST IMPRESSION: Atrophy was supratentorial small vessel disease. There is decreased attenuation throughout much of the right centrum semiovale compared with the left. This asymmetry raises question of acute infarct involving this territory. However, note that there is no loss of attenuation in the gray matter. It is possible that these changes are entirely chronic although asymmetric. 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 There is no hemorrhage or mass effect.   Assessment/Plan Altered mental  status Unresponsiveness 07/22/13-transferred to ED subsequently-the patient was found to have UTI--Keflex 500mg  qid x 7 days started 07/22/13. 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 There is no hemorrhage or mass effect. Her mentation has returned to her baseline upon my visit.     UTI (urinary tract infection) UTI per hospital-presently on 7 day course of Keflex. Will repeat Cath UA and C/S upon completion of ABT.   Atrial fibrillation Heart rate controlled on Dig , update dig level 1.0  06/14/13                Depressive disorder, not elsewhere classified Mood is managed with Amitriptyline  25mg  and Zyprexa 10mg  and Ativan 0.25mg  dialy and prn.               GERD (gastroesophageal reflux disease) Stable on Protonix 40mg  bid.         Memory loss Michaelyn Barter in her w/c, takes zyprexa for psychosis-- Zyprexa to 10mg  since 03/02/13-occasional agitation has been managed with prn Ativan             Type II or unspecified type diabetes mellitus with renal manifestations, uncontrolled(250.42) Managed with Glimepiride 2mg , Hgb A1c 6.0 01/11/13--Hgb A1c 5.9 06/14/13            Unspecified constipation Managed with Amitiza bid and Senokot S III daily                Unspecified essential hypertension Controlled on   Metoprolol 25mg  bid and Enalapril 40mg  and Furosemide 20mg                      Unspecified hypothyroidism Takes Levothyroxine 19mcg,TSH 1.464 01/11/13 and 1.030 06/14/13                    Family/ Staff Communication: observe the patient.   Goals of Care: SNF  Labs/tests ordered: Cath UA and C/S upon completion of Keflex.

## 2013-07-27 ENCOUNTER — Encounter: Payer: Self-pay | Admitting: Nurse Practitioner

## 2013-07-27 DIAGNOSIS — N390 Urinary tract infection, site not specified: Secondary | ICD-10-CM | POA: Insufficient documentation

## 2013-07-27 NOTE — Assessment & Plan Note (Signed)
Controlled on   Metoprolol 25mg bid and Enalapril 40mg and Furosemide 20mg.                              

## 2013-07-27 NOTE — Assessment & Plan Note (Addendum)
Unresponsiveness 07/22/13-transferred to ED subsequently-the patient was found to have UTI--Keflex 500mg  qid x 7 days started 07/22/13. 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 There is no hemorrhage or mass effect. Her mentation has returned to her baseline upon my visit.

## 2013-07-27 NOTE — Assessment & Plan Note (Signed)
UTI per hospital-presently on 7 day course of Keflex. Will repeat Cath UA and C/S upon completion of ABT.

## 2013-07-27 NOTE — Assessment & Plan Note (Signed)
Takes Levothyroxine 50mcg,TSH 1.464 01/11/13 and 1.030 06/14/13                             

## 2013-07-27 NOTE — Assessment & Plan Note (Signed)
Mood is managed with Amitriptyline  25mg and Zyprexa 10mg and Ativan 0.25mg dialy and prn.   

## 2013-07-27 NOTE — Assessment & Plan Note (Signed)
Heart rate controlled on Dig 125mcg, update dig level 1.0 06/14/13                         

## 2013-07-27 NOTE — Assessment & Plan Note (Signed)
Stable on Protonix 40mg bid.   

## 2013-07-27 NOTE — Assessment & Plan Note (Signed)
Managed with Amitiza bid and Senokot S III daily

## 2013-07-27 NOTE — Assessment & Plan Note (Signed)
Managed with Glimepiride 2mg, Hgb A1c 6.0 01/11/13--Hgb A1c 5.9 06/14/13                   

## 2013-07-27 NOTE — Assessment & Plan Note (Signed)
Wanders in her w/c, takes zyprexa for psychosis-- Zyprexa to 10mg  since 03/02/13-occasional agitation has been managed with prn Ativan

## 2013-08-16 LAB — LIPID PANEL
HDL: 40 mg/dL (ref 35–70)
LDL Cholesterol: 151 mg/dL

## 2013-08-28 ENCOUNTER — Non-Acute Institutional Stay (SKILLED_NURSING_FACILITY): Payer: Medicare Other | Admitting: Nurse Practitioner

## 2013-08-28 DIAGNOSIS — E039 Hypothyroidism, unspecified: Secondary | ICD-10-CM

## 2013-08-28 DIAGNOSIS — E1129 Type 2 diabetes mellitus with other diabetic kidney complication: Secondary | ICD-10-CM

## 2013-08-28 DIAGNOSIS — F329 Major depressive disorder, single episode, unspecified: Secondary | ICD-10-CM

## 2013-08-28 DIAGNOSIS — K5900 Constipation, unspecified: Secondary | ICD-10-CM

## 2013-08-28 DIAGNOSIS — K219 Gastro-esophageal reflux disease without esophagitis: Secondary | ICD-10-CM

## 2013-08-28 DIAGNOSIS — I10 Essential (primary) hypertension: Secondary | ICD-10-CM

## 2013-08-28 DIAGNOSIS — I4891 Unspecified atrial fibrillation: Secondary | ICD-10-CM

## 2013-08-28 NOTE — Assessment & Plan Note (Addendum)
Controlled on   Metoprolol 25mg  bid and Enalapril 40mg  and Furosemide 20mg . Occasional elevated SBP 150-160s-asymptomatic.

## 2013-08-28 NOTE — Assessment & Plan Note (Signed)
Stable on Protonix 40mg bid.   

## 2013-08-28 NOTE — Assessment & Plan Note (Signed)
Mood is managed with Amitriptyline  25mg and Zyprexa 10mg and Ativan 0.25mg dialy and prn.   

## 2013-08-28 NOTE — Assessment & Plan Note (Signed)
Managed with Amitiza bid and Senokot S III daily

## 2013-08-28 NOTE — Assessment & Plan Note (Signed)
Heart rate controlled on Dig 125mcg, update dig level 1.0 06/14/13                         

## 2013-08-28 NOTE — Assessment & Plan Note (Signed)
Takes Levothyroxine 50mcg,TSH 1.464 01/11/13 and 1.030 06/14/13                             

## 2013-08-28 NOTE — Progress Notes (Signed)
Patient ID: Selena Ward, female   DOB: 09-19-1918, 79 y.o.   MRN: [id number]  Code Status: DNR  Allergies  Allergen Reactions  . Niacin And Related   . Nsaids     Chief Complaint  Patient presents with  . Medical Managment of Chronic Issues    HPI: Patient is a 79 y.o. female seen in the SNF at [facility] today for her chronic medical conditions.      Problem List Items Addressed This Visit   Atrial fibrillation     Heart rate controlled on Dig , update dig level 1.0 06/14/13                    Depressive disorder, not elsewhere classified     Mood is managed with Amitriptyline  25mg  and Zyprexa 10mg  and Ativan 0.25mg  dialy and prn.                   GERD (gastroesophageal reflux disease)     Stable on Protonix 40mg  bid.             Type II or unspecified type diabetes mellitus with renal manifestations, uncontrolled(250.42) - Primary     Managed with Glimepiride 2mg , Hgb A1c 6.0 01/11/13--Hgb A1c 5.9 06/14/13                Unspecified constipation     Managed with Amitiza bid and Senokot S III daily                    Unspecified essential hypertension (Chronic)     Controlled on   Metoprolol 25mg  bid and Enalapril 40mg  and Furosemide 20mg . Occasional elevated SBP 150-160s-asymptomatic.                         Unspecified hypothyroidism     Takes Levothyroxine 26mcg,TSH 1.464 01/11/13 and 1.030 06/14/13                         Review of Systems:  Review of Systems  Constitutional: Negative for fever, chills, weight loss, malaise/fatigue and diaphoresis.  HENT: Positive for hearing loss. Negative for congestion, ear pain and sore throat.   Eyes: Negative for pain, discharge and redness.  Respiratory: Negative for cough, shortness of breath and wheezing.   Cardiovascular: Negative for chest pain, palpitations, orthopnea, claudication, leg swelling  and PND.  Gastrointestinal: Negative for heartburn, nausea, vomiting, abdominal pain, diarrhea, constipation and blood in stool.  Genitourinary: Negative for dysuria, urgency, hematuria and flank pain. Frequency: incontinent of bladder.  Musculoskeletal: Negative for back pain, falls, joint pain, myalgias and neck pain.       Leaning to her right with RUE weaker than the left  Skin: Negative for itching and rash.  Neurological: Negative for dizziness, sensory change, speech change, focal weakness, seizures, loss of consciousness, weakness and headaches.  Endo/Heme/Allergies: Negative for environmental allergies and polydipsia. Does not bruise/bleed easily.  Psychiatric/Behavioral: Positive for memory loss. Negative for depression and hallucinations. The patient is nervous/anxious. The patient does not have insomnia.      Past Medical History  Diagnosis Date  . Depression   . Heart murmur   . Hypertension   . Diabetes mellitus without complication   . CHF (congestive heart failure)   . Anxiety   . GERD (gastroesophageal reflux disease)     Medications:   Medication List  This list is accurate as of: 08/28/13 12:24 PM.  Always use your most recent med list.               acetaminophen 500 MG tablet  Commonly known as:  TYLENOL  Take 1,000 mg by mouth every 8 (eight) hours as needed for pain.     amitriptyline 25 MG tablet  Commonly known as:  ELAVIL  Take 25 mg by mouth at bedtime.     aspirin 81 MG chewable tablet  Chew 81 mg by mouth daily.     cephALEXin 500 MG capsule  Commonly known as:  KEFLEX  Take 1 capsule (500 mg total) by mouth 4 (four) times daily.     digoxin 0.125 MG tablet  Commonly known as:  LANOXIN  Take 0.125 mg by mouth daily. Hold for pulse >60     enalapril 20 MG tablet  Commonly known as:  VASOTEC  Take 40 mg by mouth daily.     furosemide 20 MG tablet  Commonly known as:  LASIX  Take 20 mg by mouth.     glimepiride 2 MG tablet   Commonly known as:  AMARYL  Take 2 mg by mouth daily before breakfast.     lactose free nutrition Liqd  Take 237 mLs by mouth 2 (two) times daily between meals. vanilla     levothyroxine 50 MCG tablet  Commonly known as:  SYNTHROID, LEVOTHROID  Take 50 mcg by mouth daily before breakfast.     LORazepam 0.5 MG tablet  Commonly known as:  ATIVAN  Take 1/2 tablet by mouth every day at 1pm.     lubiprostone 24 MCG capsule  Commonly known as:  AMITIZA  Take 24 mcg by mouth 2 (two) times daily with a meal.     metoprolol tartrate 25 MG tablet  Commonly known as:  LOPRESSOR  Take 25 mg by mouth 2 (two) times daily.     OLANZapine 10 MG tablet  Commonly known as:  ZYPREXA  Take 10 mg by mouth at bedtime.     pantoprazole 40 MG tablet  Commonly known as:  PROTONIX  Take 40 mg by mouth 2 (two) times daily.     polyethylene glycol packet  Commonly known as:  MIRALAX / GLYCOLAX  Take 17 g by mouth daily.     PRESCRIPTION MEDICATION  Apply 1 mL topically every 6 (six) hours as needed (for agitation). Lorazepam 1mg /ml gel     sennosides-docusate sodium 8.6-50 MG tablet  Commonly known as:  SENOKOT-S  Take 1 tablet by mouth daily.           Physical Exam: Physical Exam  Constitutional: She is oriented to person, place, and time. She appears well-developed and well-nourished.  HENT:  Head: Normocephalic and atraumatic.  Eyes: Conjunctivae and EOM are normal. Pupils are equal, round, and reactive to light.  Neck: Normal range of motion. Neck supple. No JVD present. No thyromegaly present.  Cardiovascular: Normal rate and regular rhythm.   Murmur heard.  Systolic murmur is present with a grade of 3/6  Pulmonary/Chest: Effort normal and breath sounds normal. She has no wheezes. She has no rales.  Abdominal: Soft. Bowel sounds are normal. She exhibits no shifting dullness, no distension and no mass. There is no tenderness. There is no rigidity, no rebound, no guarding, no CVA  tenderness and negative Murphy's sign. No hernia.  Multiple external hemorrhoids.   Genitourinary: No vaginal discharge found.  Musculoskeletal: Normal range of  motion. She exhibits no edema and no tenderness.  Leaning to her right while in w/c. The right hand grip strength 5/5 but weaker than the left.   Lymphadenopathy:    She has no cervical adenopathy.  Neurological: She is alert and oriented to person, place, and time. She has normal reflexes. No cranial nerve deficit. She exhibits normal muscle tone. Coordination normal.  Skin: No rash noted. No erythema.  Psychiatric: Her mood appears anxious (at times). Her affect is angry, labile and inappropriate. Her speech is not delayed and not slurred. She is agitated and aggressive. She is not actively hallucinating. Thought content is delusional. Thought content is not paranoid. Cognition and memory are impaired. She expresses impulsivity and inappropriate judgment. She exhibits abnormal recent memory and abnormal remote memory.    Filed Vitals:   08/28/13 1223  BP: 166/61  Pulse: 72  Temp: 96.9 F (36.1 C)  TempSrc: Tympanic  Resp: 16      Labs reviewed: Basic Metabolic Panel:  Recent Labs  16/10/96 03/05/13 06/14/13 07/22/13 1624  NA 140 140 137 140  K 4.6 4.2 4.3 4.0  CL  --   --   --  100  CO2  --   --   --  30  GLUCOSE  --   --   --  155*  BUN 14 23* 16 25*  CREATININE 0.8 0.8 1.0 1.07  CALCIUM  --   --   --  9.6  TSH 1.46  --  1.03  --    Liver Function Tests:  Recent Labs  01/11/13 06/14/13 07/22/13 1624  AST 13 10* 10  ALT 12 8 10   ALKPHOS 41 48 57  BILITOT  --   --  0.3  PROT  --   --  6.8  ALBUMIN  --   --  3.4*    Past Imaging Review   07/22/2013 CHEST 2 VIEW  IMPRESSION: No edema or consolidation. Elevation of right hemidiaphragm, a chronic finding. Healing fracture proximal right humerus.   07/22/2013 CT HEAD WITHOUT CONTRAST IMPRESSION: Atrophy was supratentorial small vessel disease. There is  decreased attenuation throughout much of the right centrum semiovale compared with the left. This asymmetry raises question of acute infarct involving this territory. However, note that there is no loss of attenuation in the gray matter. It is possible that these changes are entirely chronic although asymmetric. 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 There is no hemorrhage or mass effect.   Assessment/Plan Type II or unspecified type diabetes mellitus with renal manifestations, uncontrolled(250.42) Managed with Glimepiride 2mg , Hgb A1c 6.0 01/11/13--Hgb A1c 5.9 06/14/13              Unspecified hypothyroidism Takes Levothyroxine 61mcg,TSH 1.464 01/11/13 and 1.030 06/14/13                    Unspecified essential hypertension Controlled on   Metoprolol 25mg  bid and Enalapril 40mg  and Furosemide 20mg . Occasional elevated SBP 150-160s-asymptomatic.                       Unspecified constipation Managed with Amitiza bid and Senokot S III daily                  Atrial fibrillation Heart rate controlled on Dig , update dig level 1.0 06/14/13                  GERD (  gastroesophageal reflux disease) Stable on Protonix 40mg  bid.           Depressive disorder, not elsewhere classified Mood is managed with Amitriptyline  25mg  and Zyprexa 10mg  and Ativan 0.25mg  dialy and prn.                   Family/ Staff Communication: observe the patient.   Goals of Care: SNF  Labs/tests ordered: none

## 2013-08-28 NOTE — Assessment & Plan Note (Signed)
Managed with Glimepiride 2mg, Hgb A1c 6.0 01/11/13--Hgb A1c 5.9 06/14/13                   

## 2013-09-25 ENCOUNTER — Encounter: Payer: Self-pay | Admitting: Nurse Practitioner

## 2013-09-25 ENCOUNTER — Non-Acute Institutional Stay (SKILLED_NURSING_FACILITY): Payer: Medicare Other | Admitting: Nurse Practitioner

## 2013-09-25 DIAGNOSIS — E039 Hypothyroidism, unspecified: Secondary | ICD-10-CM

## 2013-09-25 DIAGNOSIS — F3289 Other specified depressive episodes: Secondary | ICD-10-CM

## 2013-09-25 DIAGNOSIS — E1129 Type 2 diabetes mellitus with other diabetic kidney complication: Secondary | ICD-10-CM

## 2013-09-25 DIAGNOSIS — I10 Essential (primary) hypertension: Secondary | ICD-10-CM

## 2013-09-25 DIAGNOSIS — E1165 Type 2 diabetes mellitus with hyperglycemia: Secondary | ICD-10-CM

## 2013-09-25 DIAGNOSIS — IMO0002 Reserved for concepts with insufficient information to code with codable children: Secondary | ICD-10-CM

## 2013-09-25 DIAGNOSIS — I4891 Unspecified atrial fibrillation: Secondary | ICD-10-CM

## 2013-09-25 DIAGNOSIS — K5900 Constipation, unspecified: Secondary | ICD-10-CM

## 2013-09-25 DIAGNOSIS — K219 Gastro-esophageal reflux disease without esophagitis: Secondary | ICD-10-CM

## 2013-09-25 DIAGNOSIS — F329 Major depressive disorder, single episode, unspecified: Secondary | ICD-10-CM

## 2013-09-25 NOTE — Assessment & Plan Note (Signed)
Takes Levothyroxine 50mcg,TSH 1.464 01/11/13 and 1.030 06/14/13                             

## 2013-09-25 NOTE — Assessment & Plan Note (Signed)
Managed with Amitiza 24mcg bid, MiraLax daily,  and Senokot S III daily   

## 2013-09-25 NOTE — Assessment & Plan Note (Signed)
Heart rate controlled on Dig 125mcg, update dig level 1.0 06/14/13                         

## 2013-09-25 NOTE — Assessment & Plan Note (Addendum)
Controlled on   Metoprolol 25mg bid and Enalapril 40mg and Furosemide 20mg.                              

## 2013-09-25 NOTE — Progress Notes (Signed)
Patient ID: Selena Ward, female   DOB: Mar 25, 1918, 79 y.o.   MRN: [id number]  Code Status: DNR  Allergies  Allergen Reactions  . Niacin And Related   . Nsaids     Chief Complaint  Patient presents with  . Medical Managment of Chronic Issues    HPI: Patient is a 79 y.o. female seen in the SNF at [facility] today for her chronic medical conditions.      Problem List Items Addressed This Visit   Atrial fibrillation - Primary     Heart rate controlled on Dig , update dig level 1.0 06/14/13                      Degeneration of intervertebral disc, site unspecified     Tylenol 500mg  tid is adequate for pain.     Depressive disorder, not elsewhere classified     Mood is managed with Amitriptyline  25mg  and Zyprexa 10mg  and Ativan 0.25mg  dialy and prn.                     GERD (gastroesophageal reflux disease)     Stable on Protonix 40mg  bid.               Type II or unspecified type diabetes mellitus with renal manifestations, uncontrolled(250.42)     Managed with Glimepiride 2mg , Hgb A1c 6.0 01/11/13--Hgb A1c 5.9 06/14/13                  Unspecified constipation     Managed with Amitiza bid, MiraLax daily,  and Senokot S III daily                      Unspecified essential hypertension (Chronic)     Controlled on   Metoprolol 25mg  bid and Enalapril 40mg  and Furosemide 20mg .                           Unspecified hypothyroidism     Takes Levothyroxine 72mcg,TSH 1.464 01/11/13 and 1.030 06/14/13                           Review of Systems:  Review of Systems  Constitutional: Negative for fever, chills, weight loss, malaise/fatigue and diaphoresis.  HENT: Positive for hearing loss. Negative for congestion, ear pain and sore throat.   Eyes: Negative for pain, discharge and redness.  Respiratory: Negative for cough, shortness of breath and  wheezing.   Cardiovascular: Negative for chest pain, palpitations, orthopnea, claudication, leg swelling and PND.  Gastrointestinal: Negative for heartburn, nausea, vomiting, abdominal pain, diarrhea, constipation and blood in stool.  Genitourinary: Negative for dysuria, urgency, hematuria and flank pain. Frequency: incontinent of bladder.  Musculoskeletal: Negative for back pain, falls, joint pain, myalgias and neck pain.       Leaning to her right with RUE weaker than the left  Skin: Negative for itching and rash.  Neurological: Negative for dizziness, sensory change, speech change, focal weakness, seizures, loss of consciousness, weakness and headaches.  Endo/Heme/Allergies: Negative for environmental allergies and polydipsia. Does not bruise/bleed easily.  Psychiatric/Behavioral: Positive for memory loss. Negative for depression and hallucinations. The patient is nervous/anxious. The patient does not have insomnia.      Past Medical History  Diagnosis Date  . Depression   . Heart murmur   . Hypertension   .  Diabetes mellitus without complication   . CHF (congestive heart failure)   . Anxiety   . GERD (gastroesophageal reflux disease)     Medications:   Medication List       This list is accurate as of: 09/25/13  1:45 PM.  Always use your most recent med list.               acetaminophen 500 MG tablet  Commonly known as:  TYLENOL  Take 1,000 mg by mouth every 8 (eight) hours as needed for pain.     amitriptyline 25 MG tablet  Commonly known as:  ELAVIL  Take 25 mg by mouth at bedtime.     aspirin 81 MG chewable tablet  Chew 81 mg by mouth daily.     cephALEXin 500 MG capsule  Commonly known as:  KEFLEX  Take 1 capsule (500 mg total) by mouth 4 (four) times daily.     digoxin 0.125 MG tablet  Commonly known as:  LANOXIN  Take 0.125 mg by mouth daily. Hold for pulse >60     enalapril 20 MG tablet  Commonly known as:  VASOTEC  Take 40 mg by mouth daily.      furosemide 20 MG tablet  Commonly known as:  LASIX  Take 20 mg by mouth.     glimepiride 2 MG tablet  Commonly known as:  AMARYL  Take 2 mg by mouth daily before breakfast.     lactose free nutrition Liqd  Take 237 mLs by mouth 2 (two) times daily between meals. vanilla     levothyroxine 50 MCG tablet  Commonly known as:  SYNTHROID, LEVOTHROID  Take 50 mcg by mouth daily before breakfast.     LORazepam 0.5 MG tablet  Commonly known as:  ATIVAN  Take 1/2 tablet by mouth every day at 1pm.     lubiprostone 24 MCG capsule  Commonly known as:  AMITIZA  Take 24 mcg by mouth 2 (two) times daily with a meal.     metoprolol tartrate 25 MG tablet  Commonly known as:  LOPRESSOR  Take 25 mg by mouth 2 (two) times daily.     OLANZapine 10 MG tablet  Commonly known as:  ZYPREXA  Take 10 mg by mouth at bedtime.     pantoprazole 40 MG tablet  Commonly known as:  PROTONIX  Take 40 mg by mouth 2 (two) times daily.     polyethylene glycol packet  Commonly known as:  MIRALAX / GLYCOLAX  Take 17 g by mouth daily.     PRESCRIPTION MEDICATION  Apply 1 mL topically every 6 (six) hours as needed (for agitation). Lorazepam 1mg /ml gel     sennosides-docusate sodium 8.6-50 MG tablet  Commonly known as:  SENOKOT-S  Take 1 tablet by mouth daily.           Physical Exam: Physical Exam  Constitutional: She is oriented to person, place, and time. She appears well-developed and well-nourished.  HENT:  Head: Normocephalic and atraumatic.  Eyes: Conjunctivae and EOM are normal. Pupils are equal, round, and reactive to light.  Neck: Normal range of motion. Neck supple. No JVD present. No thyromegaly present.  Cardiovascular: Normal rate and regular rhythm.   Murmur heard.  Systolic murmur is present with a grade of 3/6  Pulmonary/Chest: Effort normal and breath sounds normal. She has no wheezes. She has no rales.  Abdominal: Soft. Bowel sounds are normal. She exhibits no shifting dullness,  no distension and no  mass. There is no tenderness. There is no rigidity, no rebound, no guarding, no CVA tenderness and negative Murphy's sign. No hernia.  Multiple external hemorrhoids.   Genitourinary: No vaginal discharge found.  Musculoskeletal: Normal range of motion. She exhibits no edema and no tenderness.  Leaning to her right while in w/c. The right hand grip strength 5/5 but weaker than the left.   Lymphadenopathy:    She has no cervical adenopathy.  Neurological: She is alert and oriented to person, place, and time. She has normal reflexes. No cranial nerve deficit. She exhibits normal muscle tone. Coordination normal.  Skin: No rash noted. No erythema.  Psychiatric: Her mood appears anxious (at times). Her affect is angry, labile and inappropriate. Her speech is not delayed and not slurred. She is agitated and aggressive. She is not actively hallucinating. Thought content is delusional. Thought content is not paranoid. Cognition and memory are impaired. She expresses impulsivity and inappropriate judgment. She exhibits abnormal recent memory and abnormal remote memory.    Filed Vitals:   09/25/13 1249  BP: 122/54  Pulse: 68  Temp: 96.1 F (35.6 C)  TempSrc: Tympanic  Resp: 18      Labs reviewed: Basic Metabolic Panel:  Recent Labs  16/10/96 03/05/13 06/14/13 07/22/13 1624  NA 140 140 137 140  K 4.6 4.2 4.3 4.0  CL  --   --   --  100  CO2  --   --   --  30  GLUCOSE  --   --   --  155*  BUN 14 23* 16 25*  CREATININE 0.8 0.8 1.0 1.07  CALCIUM  --   --   --  9.6  TSH 1.46  --  1.03  --    Liver Function Tests:  Recent Labs  01/11/13 06/14/13 07/22/13 1624  AST 13 10* 10  ALT 12 8 10   ALKPHOS 41 48 57  BILITOT  --   --  0.3  PROT  --   --  6.8  ALBUMIN  --   --  3.4*    Past Imaging Review   07/22/2013 CHEST 2 VIEW  IMPRESSION: No edema or consolidation. Elevation of right hemidiaphragm, a chronic finding. Healing fracture proximal right humerus.    07/22/2013 CT HEAD WITHOUT CONTRAST IMPRESSION: Atrophy was supratentorial small vessel disease. There is decreased attenuation throughout much of the right centrum semiovale compared with the left. This asymmetry raises question of acute infarct involving this territory. However, note that there is no loss of attenuation in the gray matter. It is possible that these changes are entirely chronic although asymmetric. 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 There is no hemorrhage or mass effect.   Assessment/Plan Type II or unspecified type diabetes mellitus with renal manifestations, uncontrolled(250.42) Managed with Glimepiride 2mg , Hgb A1c 6.0 01/11/13--Hgb A1c 5.9 06/14/13                Atrial fibrillation Heart rate controlled on Dig , update dig level 1.0 06/14/13                    Unspecified essential hypertension Controlled on   Metoprolol 25mg  bid and Enalapril 40mg  and Furosemide 20mg .                         GERD (gastroesophageal reflux disease) Stable on Protonix 40mg  bid.  Unspecified hypothyroidism Takes Levothyroxine 8mcg,TSH 1.464 01/11/13 and 1.030 06/14/13                      Depressive disorder, not elsewhere classified Mood is managed with Amitriptyline  25mg  and Zyprexa 10mg  and Ativan 0.25mg  dialy and prn.                   Unspecified constipation Managed with Amitiza bid, MiraLax daily,  and Senokot S III daily                    Degeneration of intervertebral disc, site unspecified Tylenol 500mg  tid is adequate for pain.     Family/ Staff Communication: observe the patient.   Goals of Care: SNF  Labs/tests ordered: none

## 2013-09-25 NOTE — Assessment & Plan Note (Signed)
Managed with Glimepiride 2mg, Hgb A1c 6.0 01/11/13--Hgb A1c 5.9 06/14/13                   

## 2013-09-25 NOTE — Assessment & Plan Note (Signed)
Tylenol 500mg tid is adequate for pain.   

## 2013-09-25 NOTE — Assessment & Plan Note (Signed)
Mood is managed with Amitriptyline  25mg and Zyprexa 10mg and Ativan 0.25mg dialy and prn.   

## 2013-09-25 NOTE — Assessment & Plan Note (Signed)
Stable on Protonix 40mg bid.   

## 2013-10-11 ENCOUNTER — Non-Acute Institutional Stay (SKILLED_NURSING_FACILITY): Payer: Medicare Other | Admitting: Nurse Practitioner

## 2013-10-11 DIAGNOSIS — K5900 Constipation, unspecified: Secondary | ICD-10-CM

## 2013-10-11 DIAGNOSIS — K219 Gastro-esophageal reflux disease without esophagitis: Secondary | ICD-10-CM

## 2013-10-11 DIAGNOSIS — F329 Major depressive disorder, single episode, unspecified: Secondary | ICD-10-CM

## 2013-10-11 DIAGNOSIS — R413 Other amnesia: Secondary | ICD-10-CM

## 2013-10-11 DIAGNOSIS — I10 Essential (primary) hypertension: Secondary | ICD-10-CM

## 2013-10-11 DIAGNOSIS — E039 Hypothyroidism, unspecified: Secondary | ICD-10-CM

## 2013-10-11 DIAGNOSIS — I4891 Unspecified atrial fibrillation: Secondary | ICD-10-CM

## 2013-10-11 DIAGNOSIS — E1129 Type 2 diabetes mellitus with other diabetic kidney complication: Secondary | ICD-10-CM

## 2013-10-13 ENCOUNTER — Encounter: Payer: Self-pay | Admitting: Nurse Practitioner

## 2013-10-13 NOTE — Assessment & Plan Note (Signed)
Heart rate controlled on Dig 125mcg, update dig level 1.0 06/14/13                         

## 2013-10-13 NOTE — Progress Notes (Signed)
Patient ID: Selena Ward, female   DOB: 1918-08-05, 79 y.o.   MRN: [id number]   Code Status: DNR  Allergies  Allergen Reactions  . Niacin And Related   . Nsaids     Chief Complaint  Patient presents with  . Medical Managment of Chronic Issues    HPI: Patient is a 79 y.o. female seen in the SNF at [facility] today for evaluation of agitation and other chronic medical conditions.  Problem List Items Addressed This Visit   Unspecified essential hypertension - Primary (Chronic)     Controlled on   Metoprolol 25mg  bid and Enalapril 40mg  and Furosemide 20mg . Update CMP                            Unspecified hypothyroidism     Takes Levothyroxine 45mcg,TSH 1.464 01/11/13 and 1.030 06/14/13                          Type II or unspecified type diabetes mellitus with renal manifestations, uncontrolled(250.42)     Managed with Glimepiride 2mg , Hgb A1c 6.0 01/11/13--Hgb A1c 5.9 06/14/13                    Depressive disorder, not elsewhere classified     Mood is managed with Amitriptyline  25mg  and Zyprexa 10mg  and Ativan 0.25mg  dialy and prn. Obtain CBC                      Atrial fibrillation     Heart rate controlled on Dig , update dig level 1.0 06/14/13                        Unspecified constipation     Managed with Amitiza bid, MiraLax daily,  and Senokot S III daily                        Memory loss     Wanders in her w/c, takes zyprexa for psychosis-- Zyprexa to 10mg  since 03/02/13-occasional agitation has been managed with prn Ativan                 GERD (gastroesophageal reflux disease)     Stable on Protonix 40mg  bid.                    Review of Systems:  Review of Systems  Constitutional: Negative for fever, chills, weight loss, malaise/fatigue and diaphoresis.  HENT: Positive for hearing loss. Negative for  congestion, ear pain and sore throat.   Eyes: Negative for pain, discharge and redness.  Respiratory: Negative for cough, shortness of breath and wheezing.   Cardiovascular: Negative for chest pain, palpitations, orthopnea, claudication, leg swelling and PND.  Gastrointestinal: Negative for heartburn, nausea, vomiting, abdominal pain, diarrhea, constipation and blood in stool.  Genitourinary: Negative for dysuria, urgency, hematuria and flank pain. Frequency: incontinent of bladder.  Musculoskeletal: Negative for back pain, falls, joint pain, myalgias and neck pain.       Leaning to her right with RUE weaker than the left  Skin: Negative for itching and rash.  Neurological: Negative for dizziness, sensory change, speech change, focal weakness, seizures, loss of consciousness, weakness and headaches.  Endo/Heme/Allergies: Negative for environmental allergies and polydipsia. Does not bruise/bleed easily.  Psychiatric/Behavioral: Positive for memory loss. Negative for depression and  hallucinations. The patient is nervous/anxious. The patient does not have insomnia.      Past Medical History  Diagnosis Date  . Depression   . Heart murmur   . Hypertension   . Diabetes mellitus without complication   . CHF (congestive heart failure)   . Anxiety   . GERD (gastroesophageal reflux disease)    Medications: Patient's Medications  New Prescriptions   No medications on file  Previous Medications   ACETAMINOPHEN (TYLENOL) 500 MG TABLET    Take 1,000 mg by mouth 3 (three) times daily.    AMITRIPTYLINE (ELAVIL) 25 MG TABLET    Take 25 mg by mouth at bedtime.   ASPIRIN 81 MG CHEWABLE TABLET    Chew 81 mg by mouth daily.   DIGOXIN (LANOXIN) 0.125 MG TABLET    Take 0.125 mg by mouth daily. Hold for pulse >60   ENALAPRIL (VASOTEC) 20 MG TABLET    Take 40 mg by mouth daily.   FUROSEMIDE (LASIX) 20 MG TABLET    Take 20 mg by mouth.   GLIMEPIRIDE (AMARYL) 2 MG TABLET    Take 2 mg by mouth daily before  breakfast.   LACTOSE FREE NUTRITION (BOOST) LIQD    Take 237 mLs by mouth 2 (two) times daily between meals. vanilla   LEVOTHYROXINE (SYNTHROID, LEVOTHROID) 50 MCG TABLET    Take 50 mcg by mouth daily before breakfast.   LORAZEPAM (ATIVAN) 0.5 MG TABLET    Take 1/2 tablet by mouth every day at 1pm.   LUBIPROSTONE (AMITIZA) 24 MCG CAPSULE    Take 24 mcg by mouth 2 (two) times daily with a meal.   METOPROLOL TARTRATE (LOPRESSOR) 25 MG TABLET    Take 25 mg by mouth 2 (two) times daily.   OLANZAPINE (ZYPREXA) 10 MG TABLET    Take 10 mg by mouth at bedtime.   PANTOPRAZOLE (PROTONIX) 40 MG TABLET    Take 40 mg by mouth 2 (two) times daily.   POLYETHYLENE GLYCOL (MIRALAX / GLYCOLAX) PACKET    Take 17 g by mouth daily.   PRESCRIPTION MEDICATION    Apply 1 mL topically every 6 (six) hours as needed (for agitation). Lorazepam 1mg /ml gel   SENNOSIDES-DOCUSATE SODIUM (SENOKOT-S) 8.6-50 MG TABLET    Take 3 tablets by mouth daily.   Modified Medications   No medications on file  Discontinued Medications   CEPHALEXIN (KEFLEX) 500 MG CAPSULE    Take 1 capsule (500 mg total) by mouth 4 (four) times daily.     Physical Exam: Physical Exam  Constitutional: She is oriented to person, place, and time. She appears well-developed and well-nourished.  HENT:  Head: Normocephalic and atraumatic.  Eyes: Conjunctivae and EOM are normal. Pupils are equal, round, and reactive to light.  Neck: Normal range of motion. Neck supple. No JVD present. No thyromegaly present.  Cardiovascular: Normal rate and regular rhythm.   Murmur heard.  Systolic murmur is present with a grade of 3/6  3/6  Pulmonary/Chest: Effort normal and breath sounds normal. She has no wheezes. She has no rales.  Abdominal: Soft. Bowel sounds are normal. She exhibits no shifting dullness, no distension and no mass. There is no tenderness. There is no rigidity, no rebound, no guarding, no CVA tenderness and negative Murphy's sign. No hernia.    Multiple external hemorrhoids.   Genitourinary: No vaginal discharge found.  Musculoskeletal: Normal range of motion. She exhibits no edema and no tenderness.  Leaning to her right while in w/c. The  right hand grip strength 5/5 but weaker than the left.   Lymphadenopathy:    She has no cervical adenopathy.  Neurological: She is alert and oriented to person, place, and time. She has normal reflexes. No cranial nerve deficit. She exhibits normal muscle tone. Coordination normal.  Skin: No rash noted. No erythema.  Psychiatric: Her mood appears anxious (at times). Her affect is angry, labile and inappropriate. Her speech is not delayed and not slurred. She is agitated and aggressive. She is not actively hallucinating. Thought content is delusional. Thought content is not paranoid. Cognition and memory are impaired. She expresses impulsivity and inappropriate judgment. She exhibits abnormal recent memory and abnormal remote memory.    Filed Vitals:   10/11/13 1451  BP: 132/72  Pulse: 95  Temp: 97.9 F (36.6 C)  TempSrc: Tympanic  Resp: 16      Labs reviewed: Basic Metabolic Panel:  Recent Labs  16/10/96 03/05/13 06/14/13 07/22/13 1624  NA 140 140 137 140  K 4.6 4.2 4.3 4.0  CL  --   --   --  100  CO2  --   --   --  30  GLUCOSE  --   --   --  155*  BUN 14 23* 16 25*  CREATININE 0.8 0.8 1.0 1.07  CALCIUM  --   --   --  9.6  TSH 1.46  --  1.03  --    Liver Function Tests:  Recent Labs  01/11/13 06/14/13 07/22/13 1624  AST 13 10* 10  ALT 12 8 10   ALKPHOS 41 48 57  BILITOT  --   --  0.3  PROT  --   --  6.8  ALBUMIN  --   --  3.4*   CBC:  Recent Labs  01/11/13 03/05/13 06/14/13 07/22/13 1624  WBC 6.9 7.0 6.6 11.6*  NEUTROABS  --   --   --  9.8*  HGB 13.5 13.2 13.9 13.4  HCT 40 38 40 41.4  MCV  --   --   --  93.2  PLT 241 234  --  283   Lipid Panel:  Recent Labs  08/16/13  CHOL 218*  HDL 40  LDLCALC 151  TRIG 045    Assessment/Plan Unspecified  essential hypertension Controlled on   Metoprolol 25mg  bid and Enalapril 40mg  and Furosemide 20mg . Update CMP                          Unspecified hypothyroidism Takes Levothyroxine 29mcg,TSH 1.464 01/11/13 and 1.030 06/14/13                        Type II or unspecified type diabetes mellitus with renal manifestations, uncontrolled(250.42) Managed with Glimepiride 2mg , Hgb A1c 6.0 01/11/13--Hgb A1c 5.9 06/14/13                  Depressive disorder, not elsewhere classified Mood is managed with Amitriptyline  25mg  and Zyprexa 10mg  and Ativan 0.25mg  dialy and prn. Obtain CBC                    Atrial fibrillation Heart rate controlled on Dig , update dig level 1.0 06/14/13                      Unspecified constipation Managed with Amitiza bid, MiraLax daily,  and Senokot S III daily  Memory loss Wanders in her w/c, takes zyprexa for psychosis-- Zyprexa to 10mg  since 03/02/13-occasional agitation has been managed with prn Ativan               GERD (gastroesophageal reflux disease) Stable on Protonix 40mg  bid.                 Family/ Staff Communication: observe the patient.   Goals of Care: SNF  Labs/tests ordered: CBC and CMP

## 2013-10-13 NOTE — Assessment & Plan Note (Signed)
Wanders in her w/c, takes zyprexa for psychosis-- Zyprexa to 10mg  since 03/02/13-occasional agitation has been managed with prn Ativan

## 2013-10-13 NOTE — Assessment & Plan Note (Signed)
Stable on Protonix 40mg bid.   

## 2013-10-13 NOTE — Assessment & Plan Note (Addendum)
Controlled on   Metoprolol 25mg  bid and Enalapril 40mg  and Furosemide 20mg . Update CMP

## 2013-10-13 NOTE — Assessment & Plan Note (Signed)
Managed with Amitiza 24mcg bid, MiraLax daily,  and Senokot S III daily   

## 2013-10-13 NOTE — Assessment & Plan Note (Addendum)
Mood is managed with Amitriptyline  25mg  and Zyprexa 10mg  and Ativan 0.25mg  dialy and prn. Obtain CBC

## 2013-10-13 NOTE — Assessment & Plan Note (Signed)
Managed with Glimepiride 2mg, Hgb A1c 6.0 01/11/13--Hgb A1c 5.9 06/14/13                   

## 2013-10-13 NOTE — Assessment & Plan Note (Signed)
Takes Levothyroxine 50mcg,TSH 1.464 01/11/13 and 1.030 06/14/13                             

## 2013-10-15 LAB — HEPATIC FUNCTION PANEL
ALT: 10 U/L (ref 7–35)
AST: 13 U/L (ref 13–35)
Alkaline Phosphatase: 58 U/L (ref 25–125)
Bilirubin, Total: 0.6 mg/dL

## 2013-10-15 LAB — CBC AND DIFFERENTIAL
HCT: 42 % (ref 36–46)
Hemoglobin: 14.3 g/dL (ref 12.0–16.0)
Platelets: 287 10*3/uL (ref 150–399)
WBC: 8.3 10^3/mL

## 2013-10-15 LAB — BASIC METABOLIC PANEL
Glucose: 142 mg/dL
Sodium: 139 mmol/L (ref 137–147)

## 2013-10-19 ENCOUNTER — Non-Acute Institutional Stay (SKILLED_NURSING_FACILITY): Payer: Medicare Other | Admitting: Nurse Practitioner

## 2013-10-19 ENCOUNTER — Encounter: Payer: Self-pay | Admitting: Nurse Practitioner

## 2013-10-19 DIAGNOSIS — I10 Essential (primary) hypertension: Secondary | ICD-10-CM

## 2013-10-19 DIAGNOSIS — F329 Major depressive disorder, single episode, unspecified: Secondary | ICD-10-CM

## 2013-10-19 DIAGNOSIS — K5900 Constipation, unspecified: Secondary | ICD-10-CM

## 2013-10-19 DIAGNOSIS — R413 Other amnesia: Secondary | ICD-10-CM

## 2013-10-19 DIAGNOSIS — R4182 Altered mental status, unspecified: Secondary | ICD-10-CM

## 2013-10-19 DIAGNOSIS — I4891 Unspecified atrial fibrillation: Secondary | ICD-10-CM

## 2013-10-19 DIAGNOSIS — W19XXXA Unspecified fall, initial encounter: Secondary | ICD-10-CM

## 2013-10-19 DIAGNOSIS — K219 Gastro-esophageal reflux disease without esophagitis: Secondary | ICD-10-CM

## 2013-10-19 DIAGNOSIS — W19XXXD Unspecified fall, subsequent encounter: Secondary | ICD-10-CM

## 2013-10-19 DIAGNOSIS — E039 Hypothyroidism, unspecified: Secondary | ICD-10-CM

## 2013-10-19 NOTE — Assessment & Plan Note (Signed)
Stable on Protonix 40mg bid.   

## 2013-10-19 NOTE — Assessment & Plan Note (Signed)
Managed with Amitiza 24mcg bid, MiraLax daily,  and Senokot S III daily   

## 2013-10-19 NOTE — Assessment & Plan Note (Signed)
Periodically agitation--usually associated with onset of UTI--Cath UA C/S to r/o UTI.

## 2013-10-19 NOTE — Assessment & Plan Note (Signed)
10/17/13 and 10/19/13 w/o apparent injury-intensive supervision needed for safety since the patient has no safety awareness due to advanced dementia and her increased frailty in general.

## 2013-10-19 NOTE — Assessment & Plan Note (Signed)
Heart rate controlled on Dig 125mcg, update dig level 1.0 06/14/13                         

## 2013-10-19 NOTE — Assessment & Plan Note (Signed)
Controlled on   Metoprolol 25mg  bid and Enalapril 40mg  and Furosemide 20mg .elevated Bps associated with onset of agitation or falling, otherwise her blood pressures 140s/80s.

## 2013-10-19 NOTE — Assessment & Plan Note (Signed)
Wanders in her w/c, takes zyprexa for psychosis-- Zyprexa 10mg  since 03/02/13-occasional agitation has been managed with prn Ativan

## 2013-10-19 NOTE — Assessment & Plan Note (Signed)
Mood is managed with Amitriptyline  25mg and Zyprexa 10mg and Ativan 0.25mg dialy and prn.   

## 2013-10-19 NOTE — Assessment & Plan Note (Signed)
Takes Levothyroxine 50mcg,TSH 1.464 01/11/13 and 1.030 06/14/13                             

## 2013-10-19 NOTE — Progress Notes (Signed)
Patient ID: Selena Ward, female   DOB: 1918/05/13, 79 y.o.   MRN: [id number]   Code Status: DNR  Allergies  Allergen Reactions  . Niacin And Related   . Nsaids     Chief Complaint  Patient presents with  . Medical Managment of Chronic Issues    fall, agitation.   . Acute Visit    HPI: Patient is a 79 y.o. female seen in the SNF at [facility] today for evaluation of agitation, s/p fall x2,  and other chronic medical conditions.  Problem List Items Addressed This Visit   Unspecified essential hypertension - Primary (Chronic)     Controlled on   Metoprolol 25mg  bid and Enalapril 40mg  and Furosemide 20mg .elevated Bps associated with onset of agitation or falling, otherwise her blood pressures 140s/80s.                               Unspecified hypothyroidism     Takes Levothyroxine 38mcg,TSH 1.464 01/11/13 and 1.030 06/14/13                            Depressive disorder, not elsewhere classified     Mood is managed with Amitriptyline  25mg  and Zyprexa 10mg  and Ativan 0.25mg  dialy and prn.                         Atrial fibrillation     Heart rate controlled on Dig , update dig level 1.0 06/14/13                          Unspecified constipation     Managed with Amitiza bid, MiraLax daily,  and Senokot S III daily                          Memory loss     Wanders in her w/c, takes zyprexa for psychosis-- Zyprexa 10mg  since 03/02/13-occasional agitation has been managed with prn Ativan                   Fall     10/17/13 and 10/19/13 w/o apparent injury-intensive supervision needed for safety since the patient has no safety awareness due to advanced dementia and her increased frailty in general.     GERD (gastroesophageal reflux disease)     Stable on Protonix 40mg  bid.                   Altered mental status   Periodically agitation--usually associated with onset of UTI--Cath UA C/S to r/o UTI.        Review of Systems:  Review of Systems  Constitutional: Negative for fever, chills, weight loss, malaise/fatigue and diaphoresis.  HENT: Positive for hearing loss. Negative for congestion, ear pain and sore throat.   Eyes: Negative for pain, discharge and redness.  Respiratory: Negative for cough, shortness of breath and wheezing.   Cardiovascular: Negative for chest pain, palpitations, orthopnea, claudication, leg swelling and PND.  Gastrointestinal: Negative for heartburn, nausea, vomiting, abdominal pain, diarrhea, constipation and blood in stool.  Genitourinary: Negative for dysuria, urgency, hematuria and flank pain. Frequency: incontinent of bladder.  Musculoskeletal: Positive for falls. Negative for back pain, joint pain, myalgias and neck pain.       Leaning to her right with RUE  weaker than the left. Fellx2 10/17/13 and 10/19/13  Skin: Negative for itching and rash.  Neurological: Negative for dizziness, sensory change, speech change, focal weakness, seizures, loss of consciousness, weakness and headaches.  Endo/Heme/Allergies: Negative for environmental allergies and polydipsia. Does not bruise/bleed easily.  Psychiatric/Behavioral: Positive for memory loss. Negative for depression and hallucinations. The patient is nervous/anxious. The patient does not have insomnia.      Past Medical History  Diagnosis Date  . Depression   . Heart murmur   . Hypertension   . Diabetes mellitus without complication   . CHF (congestive heart failure)   . Anxiety   . GERD (gastroesophageal reflux disease)    Medications: Patient's Medications  New Prescriptions   No medications on file  Previous Medications   ACETAMINOPHEN (TYLENOL) 500 MG TABLET    Take 1,000 mg by mouth 3 (three) times daily.    AMITRIPTYLINE (ELAVIL) 25 MG TABLET    Take 25 mg by mouth at bedtime.   ASPIRIN 81 MG CHEWABLE  TABLET    Chew 81 mg by mouth daily.   DIGOXIN (LANOXIN) 0.125 MG TABLET    Take 0.125 mg by mouth daily. Hold for pulse >60   ENALAPRIL (VASOTEC) 20 MG TABLET    Take 40 mg by mouth daily.   FUROSEMIDE (LASIX) 20 MG TABLET    Take 20 mg by mouth.   GLIMEPIRIDE (AMARYL) 2 MG TABLET    Take 2 mg by mouth daily before breakfast.   LACTOSE FREE NUTRITION (BOOST) LIQD    Take 237 mLs by mouth 2 (two) times daily between meals. vanilla   LEVOTHYROXINE (SYNTHROID, LEVOTHROID) 50 MCG TABLET    Take 50 mcg by mouth daily before breakfast.   LORAZEPAM (ATIVAN) 0.5 MG TABLET    Take 1/2 tablet by mouth every day at 1pm.   LUBIPROSTONE (AMITIZA) 24 MCG CAPSULE    Take 24 mcg by mouth 2 (two) times daily with a meal.   METOPROLOL TARTRATE (LOPRESSOR) 25 MG TABLET    Take 25 mg by mouth 2 (two) times daily.   OLANZAPINE (ZYPREXA) 10 MG TABLET    Take 10 mg by mouth at bedtime.   PANTOPRAZOLE (PROTONIX) 40 MG TABLET    Take 40 mg by mouth 2 (two) times daily.   POLYETHYLENE GLYCOL (MIRALAX / GLYCOLAX) PACKET    Take 17 g by mouth daily.   PRESCRIPTION MEDICATION    Apply 1 mL topically every 6 (six) hours as needed (for agitation). Lorazepam 1mg /ml gel   SENNOSIDES-DOCUSATE SODIUM (SENOKOT-S) 8.6-50 MG TABLET    Take 3 tablets by mouth daily.   Modified Medications   No medications on file  Discontinued Medications   No medications on file     Physical Exam: Physical Exam  Constitutional: She is oriented to person, place, and time. She appears well-developed and well-nourished.  HENT:  Head: Normocephalic and atraumatic.  Eyes: Conjunctivae and EOM are normal. Pupils are equal, round, and reactive to light.  Neck: Normal range of motion. Neck supple. No JVD present. No thyromegaly present.  Cardiovascular: Normal rate and regular rhythm.   Murmur heard.  Systolic murmur is present with a grade of 3/6  3/6  Pulmonary/Chest: Effort normal and breath sounds normal. She has no wheezes. She has no  rales.  Abdominal: Soft. Bowel sounds are normal. She exhibits no shifting dullness, no distension and no mass. There is no tenderness. There is no rigidity, no rebound, no guarding, no CVA tenderness and  negative Murphy's sign. No hernia.  Multiple external hemorrhoids.   Genitourinary: No vaginal discharge found.  Musculoskeletal: Normal range of motion. She exhibits no edema and no tenderness.  Leaning to her right while in w/c. The right hand grip strength 5/5 but weaker than the left.   Lymphadenopathy:    She has no cervical adenopathy.  Neurological: She is alert and oriented to person, place, and time. She has normal reflexes. No cranial nerve deficit. She exhibits normal muscle tone. Coordination normal.  Skin: No rash noted. No erythema.  Psychiatric: Her mood appears anxious (at times). Her affect is angry, labile and inappropriate. Her speech is not delayed and not slurred. She is agitated and aggressive. She is not actively hallucinating. Thought content is delusional. Thought content is not paranoid. Cognition and memory are impaired. She expresses impulsivity and inappropriate judgment. She exhibits abnormal recent memory and abnormal remote memory.    Filed Vitals:   10/19/13 1222  BP: 180/87  Pulse: 68  Temp: 96.2 F (35.7 C)  TempSrc: Tympanic  Resp: 18  SpO2: 96%      Labs reviewed: Basic Metabolic Panel:  Recent Labs  16/10/96  06/14/13 07/22/13 1624 10/15/13  NA 140  < > 137 140 139  K 4.6  < > 4.3 4.0 4.3  CL  --   --   --  100  --   CO2  --   --   --  30  --   GLUCOSE  --   --   --  155*  --   BUN 14  < > 16 25* 21  CREATININE 0.8  < > 1.0 1.07 0.9  CALCIUM  --   --   --  9.6  --   TSH 1.46  --  1.03  --   --   < > = values in this interval not displayed. Liver Function Tests:  Recent Labs  06/14/13 07/22/13 1624 10/15/13  AST 10* 10 13  ALT 8 10 10   ALKPHOS 48 57 58  BILITOT  --  0.3  --   PROT  --  6.8  --   ALBUMIN  --  3.4*  --     CBC:  Recent Labs  03/05/13 06/14/13 07/22/13 1624 10/15/13  WBC 7.0 6.6 11.6* 8.3  NEUTROABS  --   --  9.8*  --   HGB 13.2 13.9 13.4 14.3  HCT 38 40 41.4 42  MCV  --   --  93.2  --   PLT 234  --  283 287   Lipid Panel:  Recent Labs  08/16/13  CHOL 218*  HDL 40  LDLCALC 151  TRIG 045    Assessment/Plan Unspecified essential hypertension Controlled on   Metoprolol 25mg  bid and Enalapril 40mg  and Furosemide 20mg .elevated Bps associated with onset of agitation or falling, otherwise her blood pressures 140s/80s.                             GERD (gastroesophageal reflux disease) Stable on Protonix 40mg  bid.                 Fall 10/17/13 and 10/19/13 w/o apparent injury-intensive supervision needed for safety since the patient has no safety awareness due to advanced dementia and her increased frailty in general.   Memory loss Wanders in her w/c, takes zyprexa for psychosis-- Zyprexa 10mg  since 03/02/13-occasional agitation has been managed with prn Ativan  Unspecified constipation Managed with Amitiza bid, MiraLax daily,  and Senokot S III daily                        Atrial fibrillation Heart rate controlled on Dig , update dig level 1.0 06/14/13                        Depressive disorder, not elsewhere classified Mood is managed with Amitriptyline  25mg  and Zyprexa 10mg  and Ativan 0.25mg  dialy and prn.                       Unspecified hypothyroidism Takes Levothyroxine 63mcg,TSH 1.464 01/11/13 and 1.030 06/14/13                          Altered mental status Periodically agitation--usually associated with onset of UTI--Cath UA C/S to r/o UTI.     Family/ Staff Communication: observe the patient.   Goals of Care: SNF  Labs/tests ordered: Cath UA C/S

## 2013-11-07 ENCOUNTER — Other Ambulatory Visit: Payer: Self-pay | Admitting: *Deleted

## 2013-11-07 MED ORDER — LORAZEPAM 0.5 MG PO TABS: ORAL_TABLET | ORAL | Status: DC

## 2013-11-20 ENCOUNTER — Non-Acute Institutional Stay (SKILLED_NURSING_FACILITY): Payer: Medicare Other

## 2013-11-20 DIAGNOSIS — K5900 Constipation, unspecified: Secondary | ICD-10-CM

## 2013-11-20 DIAGNOSIS — R413 Other amnesia: Secondary | ICD-10-CM

## 2013-11-20 DIAGNOSIS — E1165 Type 2 diabetes mellitus with hyperglycemia: Secondary | ICD-10-CM

## 2013-11-20 DIAGNOSIS — E1129 Type 2 diabetes mellitus with other diabetic kidney complication: Secondary | ICD-10-CM

## 2013-11-20 DIAGNOSIS — F329 Major depressive disorder, single episode, unspecified: Secondary | ICD-10-CM

## 2013-11-20 DIAGNOSIS — R55 Syncope and collapse: Secondary | ICD-10-CM

## 2013-11-20 DIAGNOSIS — E039 Hypothyroidism, unspecified: Secondary | ICD-10-CM

## 2013-11-20 DIAGNOSIS — I4891 Unspecified atrial fibrillation: Secondary | ICD-10-CM

## 2013-11-20 DIAGNOSIS — K219 Gastro-esophageal reflux disease without esophagitis: Secondary | ICD-10-CM

## 2013-11-20 DIAGNOSIS — F3289 Other specified depressive episodes: Secondary | ICD-10-CM

## 2013-11-20 DIAGNOSIS — Z299 Encounter for prophylactic measures, unspecified: Secondary | ICD-10-CM

## 2013-11-20 DIAGNOSIS — I10 Essential (primary) hypertension: Secondary | ICD-10-CM

## 2013-11-20 NOTE — Assessment & Plan Note (Signed)
Heart rate controlled on Dig 132mcg, update dig level 1.0 06/14/13

## 2013-11-20 NOTE — Assessment & Plan Note (Signed)
Mood is managed with Amitriptyline  25mg and Zyprexa 10mg and Ativan 0.25mg dialy and prn.   

## 2013-11-20 NOTE — Assessment & Plan Note (Signed)
Staff reported 11/19/13 at 1:30pm 2-3 minutes episode of unresponsiveness. No focal neurological deficits noted. May consider Carotid Doppler and CT head if recurs. Observe the patient.

## 2013-11-20 NOTE — Assessment & Plan Note (Addendum)
Wanders in her w/c, takes zyprexa for psychosis-- Zyprexa 10mg since 03/02/13 and Ativan 0.25mg daily after lunch-occasional agitation has been managed with prn Ativan                    

## 2013-11-20 NOTE — Assessment & Plan Note (Signed)
Managed with Glimepiride 2mg , Hgb A1c 6.0 01/11/13--Hgb A1c 5.9 06/14/13

## 2013-11-20 NOTE — Progress Notes (Signed)
Patient ID: Selena Ward, female   DOB: 01-15-1918, 80 y.o.   MRN: [id number]   Code Status: DNR  Allergies  Allergen Reactions  . Niacin And Related   . Nsaids     Chief Complaint  Patient presents with  . Medical Managment of Chronic Issues    blood sugar, dementia, blood pressure. Had 2-3 episodes of unresponsiveness.    HPI: Patient is a 80 y.o. female seen in the SNF at [facility] today for evaluation of chronic medical conditions.  Problem List Items Addressed This Visit   Atrial fibrillation     Heart rate controlled on Dig 146mcg, update dig level 1.0 06/14/13                            Depressive disorder, not elsewhere classified     Mood is managed with Amitriptyline  25mg  and Zyprexa 10mg  and Ativan 0.25mg  dialy and prn.                         Relevant Medications      LORazepam (ATIVAN) 0.5 MG tablet   GERD (gastroesophageal reflux disease)     Stable on Protonix 40mg  bid.                     Memory loss     Selena Ward in her w/c, takes zyprexa for psychosis-- Zyprexa 10mg  since 03/02/13 and Ativan 0.25mg  daily after lunch-occasional agitation has been managed with prn Ativan                     Syncope     Staff reported 11/19/13 at 1:30pm 2-3 minutes episode of unresponsiveness. No focal neurological deficits noted. May consider Carotid Doppler and CT head if recurs. Observe the patient.     Type II or unspecified type diabetes mellitus with renal manifestations, uncontrolled(250.42)     Managed with Glimepiride 2mg , Hgb A1c 6.0 01/11/13--Hgb A1c 5.9 06/14/13                      Unspecified constipation     Managed with Amitiza 25mcg bid, MiraLax daily,  and Senokot S III daily                            Unspecified essential hypertension (Chronic)     Controlled on   Metoprolol 25mg  bid and Enalapril 40mg  and Furosemide 20mg .                                Unspecified hypothyroidism     Takes Levothyroxine 3mcg,TSH 1.464 01/11/13 and 1.030 06/14/13                               Other Visit Diagnoses   Preventive measure    -  Primary    Relevant Orders       DNR (Do Not Resuscitate)       Review of Systems:  Review of Systems  Constitutional: Negative for fever, chills, weight loss, malaise/fatigue and diaphoresis.  HENT: Positive for hearing loss. Negative for congestion, ear pain and sore throat.   Eyes: Negative for pain, discharge and redness.  Respiratory: Negative for cough, shortness of breath and wheezing.  Cardiovascular: Negative for chest pain, palpitations, orthopnea, claudication, leg swelling and PND.  Gastrointestinal: Negative for heartburn, nausea, vomiting, abdominal pain, diarrhea, constipation and blood in stool.  Genitourinary: Negative for dysuria, urgency, hematuria and flank pain. Frequency: incontinent of bladder.  Musculoskeletal: Positive for falls. Negative for back pain, joint pain, myalgias and neck pain.       Leaning to her right with RUE weaker than the left. Fellx2 10/17/13 and 10/19/13  Skin: Negative for itching and rash.  Neurological: Negative for dizziness, sensory change, speech change, focal weakness, seizures, loss of consciousness, weakness and headaches.  Endo/Heme/Allergies: Negative for environmental allergies and polydipsia. Does not bruise/bleed easily.  Psychiatric/Behavioral: Positive for memory loss. Negative for depression and hallucinations. The patient is nervous/anxious. The patient does not have insomnia.      Past Medical History  Diagnosis Date  . Depression   . Heart murmur   . Hypertension   . Diabetes mellitus without complication   . CHF (congestive heart failure)   . Anxiety   . GERD (gastroesophageal reflux disease)   . Bilateral breast cancer 2004  . Degeneration of intervertebral  disc, site unspecified   . Lumbago   . Disorder of bone and cartilage, unspecified   . Debility, unspecified   . Senile dementia with depressive features   . Unspecified hypothyroidism   . Other and unspecified hyperlipidemia   . Atrial fibrillation   . Unspecified constipation   . Hydronephrosis   . Senile osteoporosis   . Memory loss   . Closed fracture of anatomical neck of humerus   . Hypopotassemia   . Hydronephrosis    Medications: Patient's Medications  New Prescriptions   No medications on file  Previous Medications   ACETAMINOPHEN (TYLENOL) 500 MG TABLET    Take 1,000 mg by mouth 3 (three) times daily.    AMITRIPTYLINE (ELAVIL) 25 MG TABLET    Take 25 mg by mouth at bedtime.   ASPIRIN 81 MG CHEWABLE TABLET    Chew 81 mg by mouth daily.   DIGOXIN (LANOXIN) 0.125 MG TABLET    Take 0.125 mg by mouth daily. Hold for pulse >60   ENALAPRIL (VASOTEC) 20 MG TABLET    Take 40 mg by mouth daily.   FUROSEMIDE (LASIX) 20 MG TABLET    Take 20 mg by mouth.   GLIMEPIRIDE (AMARYL) 2 MG TABLET    Take 2 mg by mouth daily before breakfast.   LEVOTHYROXINE (SYNTHROID, LEVOTHROID) 50 MCG TABLET    Take 50 mcg by mouth daily before breakfast.   LUBIPROSTONE (AMITIZA) 24 MCG CAPSULE    Take 24 mcg by mouth 2 (two) times daily with a meal.   METOPROLOL TARTRATE (LOPRESSOR) 25 MG TABLET    Take 25 mg by mouth 2 (two) times daily.   NUTRITIONAL SUPPLEMENTS (RESOURCE 2.0 PO)    Take 90 mLs by mouth. Twice daily   OLANZAPINE (ZYPREXA) 10 MG TABLET    Take 10 mg by mouth at bedtime.   PANTOPRAZOLE (PROTONIX) 40 MG TABLET    Take 40 mg by mouth 2 (two) times daily.   POLYETHYLENE GLYCOL (MIRALAX / GLYCOLAX) PACKET    Take 17 g by mouth daily.   PRESCRIPTION MEDICATION    Apply 1 mL topically every 6 (six) hours as needed (for agitation). Lorazepam 1mg /ml gel   SENNOSIDES-DOCUSATE SODIUM (SENOKOT-S) 8.6-50 MG TABLET    Take 3 tablets by mouth daily.   Modified Medications   Modified Medication  Previous Medication  LORAZEPAM (ATIVAN) 0.5 MG TABLET LORazepam (ATIVAN) 0.5 MG tablet      Take one tablet by mouth every 6 hours as needed for agitation/anxiety. Take 1/2 tablet every day at 1pm    Take one tablet by mouth every 6 hours as needed for agitation/anxiety  Discontinued Medications   LACTOSE FREE NUTRITION (BOOST) LIQD    Take 237 mLs by mouth 2 (two) times daily between meals. vanilla     Physical Exam: Physical Exam  Constitutional: She is oriented to person, place, and time. She appears well-developed and well-nourished.  HENT:  Head: Normocephalic and atraumatic.  Eyes: Conjunctivae and EOM are normal. Pupils are equal, round, and reactive to light.  Neck: Normal range of motion. Neck supple. No JVD present. No thyromegaly present.  Cardiovascular: Normal rate and regular rhythm.   Murmur heard.  Systolic murmur is present with a grade of 3/6  3/6  Pulmonary/Chest: Effort normal and breath sounds normal. She has no wheezes. She has no rales.  Abdominal: Soft. Bowel sounds are normal. She exhibits no shifting dullness, no distension and no mass. There is no tenderness. There is no rigidity, no rebound, no guarding, no CVA tenderness and negative Murphy's sign. No hernia.  Multiple external hemorrhoids.   Genitourinary: No vaginal discharge found.  Musculoskeletal: Normal range of motion. She exhibits no edema and no tenderness.  Leaning to her right while in w/c. The right hand grip strength 5/5 but weaker than the left.   Lymphadenopathy:    She has no cervical adenopathy.  Neurological: She is alert and oriented to person, place, and time. She has normal reflexes. No cranial nerve deficit. She exhibits normal muscle tone. Coordination normal.  Skin: No rash noted. No erythema.  Psychiatric: Her mood appears anxious (at times). Her affect is angry, labile and inappropriate. Her speech is not delayed and not slurred. She is agitated and aggressive. She is not actively  hallucinating. Thought content is delusional. Thought content is not paranoid. Cognition and memory are impaired. She expresses impulsivity and inappropriate judgment. She exhibits abnormal recent memory and abnormal remote memory.    Filed Vitals:   11/20/13 1105  BP: 144/76  Pulse: 70  Temp: 98 F (36.7 C)  Resp: 16  SpO2: 93%      Labs reviewed: Basic Metabolic Panel:  Recent Labs  01/11/13  06/14/13 07/22/13 1624 10/15/13  NA 140  < > 137 140 139  K 4.6  < > 4.3 4.0 4.3  CL  --   --   --  100  --   CO2  --   --   --  30  --   GLUCOSE  --   --   --  155*  --   BUN 14  < > 16 25* 21  CREATININE 0.8  < > 1.0 1.07 0.9  CALCIUM  --   --   --  9.6  --   TSH 1.46  --  1.03  --   --   < > = values in this interval not displayed. Liver Function Tests:  Recent Labs  06/14/13 07/22/13 1624 10/15/13  AST 10* 10 13  ALT 8 10 10   ALKPHOS 48 57 58  BILITOT  --  0.3  --   PROT  --  6.8  --   ALBUMIN  --  3.4*  --    CBC:  Recent Labs  03/05/13 06/14/13 07/22/13 1624 10/15/13  WBC 7.0 6.6 11.6* 8.3  NEUTROABS  --   --  9.8*  --   HGB 13.2 13.9 13.4 14.3  HCT 38 40 41.4 42  MCV  --   --  93.2  --   PLT 234  --  283 287   Lipid Panel:  Recent Labs  08/16/13  CHOL 218*  HDL 40  LDLCALC 151  TRIG 133    Assessment/Plan Unspecified hypothyroidism Takes Levothyroxine 67mcg,TSH 1.464 01/11/13 and 1.030 06/14/13                            Unspecified essential hypertension Controlled on   Metoprolol 25mg  bid and Enalapril 40mg  and Furosemide 20mg .                             Atrial fibrillation Heart rate controlled on Dig 165mcg, update dig level 1.0 06/14/13                          Unspecified constipation Managed with Amitiza 42mcg bid, MiraLax daily,  and Senokot S III daily                          Type II or unspecified type diabetes mellitus with renal  manifestations, uncontrolled(250.42) Managed with Glimepiride 2mg , Hgb A1c 6.0 01/11/13--Hgb A1c 5.9 06/14/13                    Memory loss Wanders in her w/c, takes zyprexa for psychosis-- Zyprexa 10mg  since 03/02/13 and Ativan 0.25mg  daily after lunch-occasional agitation has been managed with prn Ativan                   GERD (gastroesophageal reflux disease) Stable on Protonix 40mg  bid.                   Depressive disorder, not elsewhere classified Mood is managed with Amitriptyline  25mg  and Zyprexa 10mg  and Ativan 0.25mg  dialy and prn.                       Syncope Staff reported 11/19/13 at 1:30pm 2-3 minutes episode of unresponsiveness. No focal neurological deficits noted. May consider Carotid Doppler and CT head if recurs. Observe the patient.     Family/ Staff Communication: observe the patient.   Goals of Care: SNF  Labs/tests ordered: none

## 2013-11-20 NOTE — Assessment & Plan Note (Signed)
Stable on Protonix 40mg bid.   

## 2013-11-20 NOTE — Assessment & Plan Note (Signed)
Takes Levothyroxine 50mcg,TSH 1.464 01/11/13 and 1.030 06/14/13                             

## 2013-11-20 NOTE — Assessment & Plan Note (Signed)
Controlled on   Metoprolol 25mg bid and Enalapril 40mg and Furosemide 20mg.                              

## 2013-11-20 NOTE — Assessment & Plan Note (Signed)
Managed with Amitiza 24mcg bid, MiraLax daily,  and Senokot S III daily   

## 2013-12-11 ENCOUNTER — Encounter: Payer: Self-pay | Admitting: Nurse Practitioner

## 2013-12-11 ENCOUNTER — Non-Acute Institutional Stay (SKILLED_NURSING_FACILITY): Payer: Medicare Other | Admitting: Nurse Practitioner

## 2013-12-11 DIAGNOSIS — F3289 Other specified depressive episodes: Secondary | ICD-10-CM

## 2013-12-11 DIAGNOSIS — I10 Essential (primary) hypertension: Secondary | ICD-10-CM

## 2013-12-11 DIAGNOSIS — K219 Gastro-esophageal reflux disease without esophagitis: Secondary | ICD-10-CM

## 2013-12-11 DIAGNOSIS — K5900 Constipation, unspecified: Secondary | ICD-10-CM

## 2013-12-11 DIAGNOSIS — I4891 Unspecified atrial fibrillation: Secondary | ICD-10-CM

## 2013-12-11 DIAGNOSIS — F329 Major depressive disorder, single episode, unspecified: Secondary | ICD-10-CM

## 2013-12-11 DIAGNOSIS — W19XXXA Unspecified fall, initial encounter: Secondary | ICD-10-CM

## 2013-12-11 DIAGNOSIS — E1129 Type 2 diabetes mellitus with other diabetic kidney complication: Secondary | ICD-10-CM

## 2013-12-11 DIAGNOSIS — R413 Other amnesia: Secondary | ICD-10-CM

## 2013-12-11 DIAGNOSIS — IMO0002 Reserved for concepts with insufficient information to code with codable children: Secondary | ICD-10-CM

## 2013-12-11 DIAGNOSIS — E039 Hypothyroidism, unspecified: Secondary | ICD-10-CM

## 2013-12-11 DIAGNOSIS — E1165 Type 2 diabetes mellitus with hyperglycemia: Secondary | ICD-10-CM

## 2013-12-11 NOTE — Assessment & Plan Note (Signed)
Controlled on   Metoprolol 25mg  bid and Enalapril 40mg  and Furosemide 20mg .

## 2013-12-11 NOTE — Assessment & Plan Note (Signed)
Takes Levothyroxine 18mcg,TSH 1.464 01/11/13 and 1.030 06/14/13

## 2013-12-11 NOTE — Assessment & Plan Note (Signed)
Mood is managed with Amitriptyline  25mg  and Zyprexa 10mg  and Ativan 0.25mg  dialy and prn.

## 2013-12-11 NOTE — Assessment & Plan Note (Signed)
Heart rate controlled on Dig 125mcg, update dig level 1.0 06/14/13                         

## 2013-12-11 NOTE — Progress Notes (Signed)
Patient ID: Selena Ward, female   DOB: October 12, 1918, 80 y.o.   MRN: [id number]   Code Status: DNR  Allergies  Allergen Reactions  . Niacin And Related   . Nsaids     Chief Complaint  Patient presents with  . Medical Managment of Chronic Issues    s/p fall  . Acute Visit    HPI: Patient is a 80 y.o. female seen in the SNF at [facility] today for evaluation of s/p falling and other chronic medical conditions.  Problem List Items Addressed This Visit   Atrial fibrillation     Heart rate controlled on Dig 184mcg, update dig level 1.0 06/14/13                              Degeneration of intervertebral disc, site unspecified     Tylenol 500mg  tid is adequate for pain.       Depressive disorder, not elsewhere classified     Mood is managed with Amitriptyline  25mg  and Zyprexa 10mg  and Ativan 0.25mg  dialy and prn.                           Fall     Frequent falling due to her lack of safety awareness and increased frailty. Intensive supervision in SNF for care.     GERD (gastroesophageal reflux disease)     Stable on Protonix 40mg  bid.                       Memory loss     [name] in her w/c, takes zyprexa for psychosis-- Zyprexa 10mg  since 03/02/13 and Ativan 0.25mg  daily after lunch-occasional agitation has been managed with prn Ativan                       Type II or unspecified type diabetes mellitus with renal manifestations, uncontrolled(250.42) - Primary     Managed with Glimepiride 2mg , Hgb A1c 6.0 01/11/13--Hgb A1c 5.9 06/14/13                        Unspecified constipation     Managed with Amitiza 31mcg bid, MiraLax daily,  and Senokot S III daily                              Unspecified essential hypertension (Chronic)     Controlled on   Metoprolol 25mg  bid and Enalapril 40mg  and Furosemide 20mg .                                  Unspecified hypothyroidism     Takes Levothyroxine 33mcg,TSH 1.464 01/11/13 and 1.030 06/14/13                                   Review of Systems:  Review of Systems  Constitutional: Negative for fever, chills, weight loss, malaise/fatigue and diaphoresis.  HENT: Positive for hearing loss. Negative for congestion, ear pain and sore throat.   Eyes: Negative for pain, discharge and redness.  Respiratory: Negative for cough, shortness of breath and wheezing.   Cardiovascular: Negative for chest pain, palpitations, orthopnea, claudication, leg swelling and PND.  Gastrointestinal: Negative for heartburn, nausea, vomiting, abdominal pain, diarrhea, constipation and blood in stool.  Genitourinary: Negative for dysuria, urgency, hematuria and flank pain. Frequency: incontinent of bladder.  Musculoskeletal: Positive for falls. Negative for back pain, joint pain, myalgias and neck pain.       Leaning to her right with RUE weaker than the left. Wandering and found sitting on floor in other resident's rooms--lack of safety awareness  Skin: Negative for itching and rash.  Neurological: Negative for dizziness, sensory change, speech change, focal weakness, seizures, loss of consciousness, weakness and headaches.  Endo/Heme/Allergies: Negative for environmental allergies and polydipsia. Does not bruise/bleed easily.  Psychiatric/Behavioral: Positive for memory loss. Negative for depression and hallucinations. The patient is nervous/anxious. The patient does not have insomnia.      Past Medical History  Diagnosis Date  . Depression   . Heart murmur   . Hypertension   . Diabetes mellitus without complication   . CHF (congestive heart failure)   . Anxiety   . GERD (gastroesophageal reflux disease)   . Bilateral breast cancer 2004  . Degeneration of intervertebral disc, site unspecified   . Lumbago   . Disorder  of bone and cartilage, unspecified   . Debility, unspecified   . Senile dementia with depressive features   . Unspecified hypothyroidism   . Other and unspecified hyperlipidemia   . Atrial fibrillation   . Unspecified constipation   . Hydronephrosis   . Senile osteoporosis   . Memory loss   . Closed fracture of anatomical neck of humerus   . Hypopotassemia   . Hydronephrosis    Medications: Patient's Medications  New Prescriptions   No medications on file  Previous Medications   ACETAMINOPHEN (TYLENOL) 500 MG TABLET    Take 1,000 mg by mouth 3 (three) times daily.    AMITRIPTYLINE (ELAVIL) 25 MG TABLET    Take 25 mg by mouth at bedtime.   ASPIRIN 81 MG CHEWABLE TABLET    Chew 81 mg by mouth daily.   DIGOXIN (LANOXIN) 0.125 MG TABLET    Take 0.125 mg by mouth daily. Hold for pulse >60   ENALAPRIL (VASOTEC) 20 MG TABLET    Take 40 mg by mouth daily.   FUROSEMIDE (LASIX) 20 MG TABLET    Take 20 mg by mouth.   GLIMEPIRIDE (AMARYL) 2 MG TABLET    Take 2 mg by mouth daily before breakfast.   LEVOTHYROXINE (SYNTHROID, LEVOTHROID) 50 MCG TABLET    Take 50 mcg by mouth daily before breakfast.   LORAZEPAM (ATIVAN) 0.5 MG TABLET    Take one tablet by mouth every 6 hours as needed for agitation/anxiety. Take 1/2 tablet every day at 1pm   LUBIPROSTONE (AMITIZA) 24 MCG CAPSULE    Take 24 mcg by mouth 2 (two) times daily with a meal.   METOPROLOL TARTRATE (LOPRESSOR) 25 MG TABLET    Take 25 mg by mouth 2 (two) times daily.   NUTRITIONAL SUPPLEMENTS (RESOURCE 2.0 PO)    Take 90 mLs by mouth. Twice daily   OLANZAPINE (ZYPREXA) 10 MG TABLET    Take 10 mg by mouth at bedtime.   PANTOPRAZOLE (PROTONIX) 40 MG TABLET    Take 40 mg by mouth 2 (two) times daily.   POLYETHYLENE GLYCOL (MIRALAX / GLYCOLAX) PACKET    Take 17 g by mouth daily.   PRESCRIPTION MEDICATION    Apply 1 mL topically every 6 (six) hours as needed (for agitation). Lorazepam 1mg /ml gel   SENNOSIDES-DOCUSATE SODIUM (  SENOKOT-S) 8.6-50 MG  TABLET    Take 3 tablets by mouth daily.   Modified Medications   No medications on file  Discontinued Medications   No medications on file     Physical Exam: Physical Exam  Constitutional: She is oriented to person, place, and time. She appears well-developed and well-nourished.  HENT:  Head: Normocephalic and atraumatic.  Eyes: Conjunctivae and EOM are normal. Pupils are equal, round, and reactive to light.  Neck: Normal range of motion. Neck supple. No JVD present. No thyromegaly present.  Cardiovascular: Normal rate and regular rhythm.   Murmur heard.  Systolic murmur is present with a grade of 3/6  3/6  Pulmonary/Chest: Effort normal and breath sounds normal. She has no wheezes. She has no rales.  Abdominal: Soft. Bowel sounds are normal. She exhibits no shifting dullness, no distension and no mass. There is no tenderness. There is no rigidity, no rebound, no guarding, no CVA tenderness and negative Murphy's sign. No hernia.  Multiple external hemorrhoids.   Genitourinary: No vaginal discharge found.  Musculoskeletal: Normal range of motion. She exhibits no edema and no tenderness.  Leaning to her right while in w/c. The right hand grip strength 5/5 but weaker than the left.   Lymphadenopathy:    She has no cervical adenopathy.  Neurological: She is alert and oriented to person, place, and time. She has normal reflexes. No cranial nerve deficit. She exhibits normal muscle tone. Coordination normal.  Skin: No rash noted. No erythema.  Psychiatric: Her mood appears anxious (at times). Her affect is angry, labile and inappropriate. Her speech is not delayed and not slurred. She is agitated and aggressive. She is not actively hallucinating. Thought content is delusional. Thought content is not paranoid. Cognition and memory are impaired. She expresses impulsivity and inappropriate judgment. She exhibits abnormal recent memory and abnormal remote memory.    Filed Vitals:   12/11/13  1304  BP: 126/68  Pulse: 55  Temp: 97.6 F (36.4 C)  TempSrc: Tympanic  Resp: 16      Labs reviewed: Basic Metabolic Panel:  Recent Labs  01/11/13  06/14/13 07/22/13 1624 10/15/13  NA 140  < > 137 140 139  K 4.6  < > 4.3 4.0 4.3  CL  --   --   --  100  --   CO2  --   --   --  30  --   GLUCOSE  --   --   --  155*  --   BUN 14  < > 16 25* 21  CREATININE 0.8  < > 1.0 1.07 0.9  CALCIUM  --   --   --  9.6  --   TSH 1.46  --  1.03  --   --   < > = values in this interval not displayed. Liver Function Tests:  Recent Labs  06/14/13 07/22/13 1624 10/15/13  AST 10* 10 13  ALT 8 10 10   ALKPHOS 48 57 58  BILITOT  --  0.3  --   PROT  --  6.8  --   ALBUMIN  --  3.4*  --    CBC:  Recent Labs  03/05/13 06/14/13 07/22/13 1624 10/15/13  WBC 7.0 6.6 11.6* 8.3  NEUTROABS  --   --  9.8*  --   HGB 13.2 13.9 13.4 14.3  HCT 38 40 41.4 42  MCV  --   --  93.2  --   PLT 234  --  283  287   Lipid Panel:  Recent Labs  08/16/13  CHOL 218*  HDL 40  LDLCALC 151  TRIG 133    Assessment/Plan Type II or unspecified type diabetes mellitus with renal manifestations, uncontrolled(250.42) Managed with Glimepiride 2mg , Hgb A1c 6.0 01/11/13--Hgb A1c 5.9 06/14/13                      Atrial fibrillation Heart rate controlled on Dig 180mcg, update dig level 1.0 06/14/13                            Unspecified essential hypertension Controlled on   Metoprolol 25mg  bid and Enalapril 40mg  and Furosemide 20mg .                               Unspecified hypothyroidism Takes Levothyroxine 82mcg,TSH 1.464 01/11/13 and 1.030 06/14/13                              Unspecified constipation Managed with Amitiza 75mcg bid, MiraLax daily,  and Senokot S III daily                            GERD (gastroesophageal reflux disease) Stable on Protonix 40mg  bid.                      Memory loss [name] in her w/c, takes zyprexa for psychosis-- Zyprexa 10mg  since 03/02/13 and Ativan 0.25mg  daily after lunch-occasional agitation has been managed with prn Ativan                     Depressive disorder, not elsewhere classified Mood is managed with Amitriptyline  25mg  and Zyprexa 10mg  and Ativan 0.25mg  dialy and prn.                         Degeneration of intervertebral disc, site unspecified Tylenol 500mg  tid is adequate for pain.     Fall Frequent falling due to her lack of safety awareness and increased frailty. Intensive supervision in SNF for care.     Family/ Staff Communication: observe the patient.   Goals of Care: SNF  Labs/tests ordered: none

## 2013-12-11 NOTE — Assessment & Plan Note (Signed)
Stable on Protonix 40mg bid.   

## 2013-12-11 NOTE — Assessment & Plan Note (Signed)
Managed with Amitiza 24mcg bid, MiraLax daily,  and Senokot S III daily   

## 2013-12-11 NOTE — Assessment & Plan Note (Signed)
Frequent falling due to her lack of safety awareness and increased frailty. Intensive supervision in SNF for care.

## 2013-12-11 NOTE — Assessment & Plan Note (Signed)
Wanders in her w/c, takes zyprexa for psychosis-- Zyprexa 10mg  since 03/02/13 and Ativan 0.25mg  daily after lunch-occasional agitation has been managed with prn Ativan

## 2013-12-11 NOTE — Assessment & Plan Note (Signed)
Tylenol 500mg tid is adequate for pain.   

## 2013-12-11 NOTE — Assessment & Plan Note (Signed)
Managed with Glimepiride 2mg, Hgb A1c 6.0 01/11/13--Hgb A1c 5.9 06/14/13                   

## 2014-01-04 ENCOUNTER — Encounter: Payer: Self-pay | Admitting: Nurse Practitioner

## 2014-01-04 ENCOUNTER — Non-Acute Institutional Stay (SKILLED_NURSING_FACILITY): Payer: Medicare Other | Admitting: Nurse Practitioner

## 2014-01-04 DIAGNOSIS — F329 Major depressive disorder, single episode, unspecified: Secondary | ICD-10-CM

## 2014-01-04 DIAGNOSIS — I10 Essential (primary) hypertension: Secondary | ICD-10-CM

## 2014-01-04 DIAGNOSIS — E1165 Type 2 diabetes mellitus with hyperglycemia: Secondary | ICD-10-CM

## 2014-01-04 DIAGNOSIS — I4891 Unspecified atrial fibrillation: Secondary | ICD-10-CM

## 2014-01-04 DIAGNOSIS — R413 Other amnesia: Secondary | ICD-10-CM

## 2014-01-04 DIAGNOSIS — F3289 Other specified depressive episodes: Secondary | ICD-10-CM

## 2014-01-04 DIAGNOSIS — K219 Gastro-esophageal reflux disease without esophagitis: Secondary | ICD-10-CM

## 2014-01-04 DIAGNOSIS — E039 Hypothyroidism, unspecified: Secondary | ICD-10-CM

## 2014-01-04 DIAGNOSIS — K5900 Constipation, unspecified: Secondary | ICD-10-CM

## 2014-01-04 DIAGNOSIS — E1129 Type 2 diabetes mellitus with other diabetic kidney complication: Secondary | ICD-10-CM

## 2014-01-04 DIAGNOSIS — R627 Adult failure to thrive: Secondary | ICD-10-CM | POA: Insufficient documentation

## 2014-01-04 DIAGNOSIS — IMO0002 Reserved for concepts with insufficient information to code with codable children: Secondary | ICD-10-CM

## 2014-01-04 NOTE — Assessment & Plan Note (Signed)
Rate controlled, continue Metoprolol, dc Digoxin, monitor VS daily.

## 2014-01-04 NOTE — Assessment & Plan Note (Signed)
Stable on Protonix 40mg bid.   

## 2014-01-04 NOTE — Assessment & Plan Note (Signed)
Managed with Amitiza 24mcg bid, MiraLax daily,  and Senokot S III daily   

## 2014-01-04 NOTE — Assessment & Plan Note (Addendum)
Controlled on   Metoprolol 25mg  bid and Enalapril 40mg , dc Furosemide 20mg . VS daily. Update CMP

## 2014-01-04 NOTE — Assessment & Plan Note (Signed)
Consistently and gradual weight loss># 10Ibs in the past year, almost total care of her ADLs, progressive memory loss-off memory preserving meds, sad facial looks regardless mood stabilizer--will simplify medications-dc Dib, ASA, Furosemide, Glimepiride. Check CBC, CMP, and TSH.

## 2014-01-04 NOTE — Assessment & Plan Note (Signed)
Managed with Glimepiride 2mg , Hgb A1c 6.0 01/11/13--Hgb A1c 5.9 06/14/13. Fast CBG in am <126 consistently, dc Glimepiride, continue to monitor CBG for 1 week, then re-evaluate.

## 2014-01-04 NOTE — Assessment & Plan Note (Signed)
Mood is managed with Amitriptyline  25mg and Zyprexa 10mg and Ativan 0.25mg dialy and prn.   

## 2014-01-04 NOTE — Assessment & Plan Note (Signed)
Takes Levothyroxine 71mcg,TSH 1.464 01/11/13 and 1.030 06/14/13. Update TSH

## 2014-01-04 NOTE — Assessment & Plan Note (Signed)
Tylenol 500mg tid is adequate for pain.   

## 2014-01-04 NOTE — Progress Notes (Signed)
Patient ID: Selena Ward, female   DOB: [birth year], 80 y.o.   MRN: [id number]   Code Status: DNR  Allergies  Allergen Reactions  . Niacin And Related   . Nsaids     Chief Complaint  Patient presents with  . Medical Managment of Chronic Issues    weight loss #5Ibs in one week.   . Acute Visit  . Dementia    HPI: Patient is a 80 y.o. female seen in the SNF at [facility] today for evaluation of weight loss and other chronic medical conditions.  Problem List Items Addressed This Visit   Unspecified hypothyroidism     Takes Levothyroxine 45mcg,TSH 1.464 01/11/13 and 1.030 06/14/13. Update TSH     Unspecified essential hypertension (Chronic)     Controlled on   Metoprolol 25mg  bid and Enalapril 40mg , dc Furosemide 20mg . VS daily. Update CMP      Unspecified constipation     Managed with Amitiza 55mcg bid, MiraLax daily,  and Senokot S III daily     Type II or unspecified type diabetes mellitus with renal manifestations, uncontrolled(250.42)     Managed with Glimepiride 2mg , Hgb A1c 6.0 01/11/13--Hgb A1c 5.9 06/14/13. Fast CBG in am <126 consistently, dc Glimepiride, continue to monitor CBG for 1 week, then re-evaluate.       Memory loss     [name] in her w/c, takes zyprexa for psychosis-- Zyprexa 10mg  since 03/02/13 and Ativan 0.25mg  daily after lunch-occasional agitation has been managed with prn Ativan      GERD (gastroesophageal reflux disease)     Stable on Protonix 40mg  bid.     FTT (failure to thrive) in adult     Consistently and gradual weight loss># 10Ibs in the past year, almost total care of her ADLs, progressive memory loss-off memory preserving meds, sad facial looks regardless mood stabilizer--will simplify medications-dc Dib, ASA, Furosemide, Glimepiride. Check CBC, CMP, and TSH.     Depressive disorder, not elsewhere classified     Mood is managed with Amitriptyline  25mg  and Zyprexa 10mg  and Ativan 0.25mg  dialy and prn.     Degeneration of  intervertebral disc, site unspecified     Tylenol 500mg  tid is adequate for pain.       Atrial fibrillation - Primary     Rate controlled, continue Metoprolol, dc Digoxin, monitor VS daily.        Review of Systems:  Review of Systems  Constitutional: Positive for weight loss. Negative for fever, chills, malaise/fatigue and diaphoresis.  HENT: Positive for hearing loss. Negative for congestion, ear pain and sore throat.   Eyes: Negative for pain, discharge and redness.  Respiratory: Negative for cough, shortness of breath and wheezing.   Cardiovascular: Negative for chest pain, palpitations, orthopnea, claudication, leg swelling and PND.  Gastrointestinal: Negative for heartburn, nausea, vomiting, abdominal pain, diarrhea, constipation and blood in stool.  Genitourinary: Negative for dysuria, urgency, hematuria and flank pain. Frequency: incontinent of bladder.  Musculoskeletal: Positive for falls. Negative for back pain, joint pain, myalgias and neck pain.       Leaning to her right with RUE weaker than the left. Wandering and found sitting on floor in other resident's rooms--lack of safety awareness  Skin: Negative for itching and rash.  Neurological: Negative for dizziness, sensory change, speech change, focal weakness, seizures, loss of consciousness, weakness and headaches.  Endo/Heme/Allergies: Negative for environmental allergies and polydipsia. Does not bruise/bleed easily.  Psychiatric/Behavioral: Positive for memory loss. Negative for depression and hallucinations.  The patient is nervous/anxious. The patient does not have insomnia.      Past Medical History  Diagnosis Date  . Depression   . Heart murmur   . Hypertension   . Diabetes mellitus without complication   . CHF (congestive heart failure)   . Anxiety   . GERD (gastroesophageal reflux disease)   . Bilateral breast cancer 2004  . Degeneration of intervertebral disc, site unspecified   . Lumbago   . Disorder of  bone and cartilage, unspecified   . Debility, unspecified   . Senile dementia with depressive features   . Unspecified hypothyroidism   . Other and unspecified hyperlipidemia   . Atrial fibrillation   . Unspecified constipation   . Hydronephrosis   . Senile osteoporosis   . Memory loss   . Closed fracture of anatomical neck of humerus   . Hypopotassemia   . Hydronephrosis    Medications: Patient's Medications  New Prescriptions   No medications on file  Previous Medications   ACETAMINOPHEN (TYLENOL) 500 MG TABLET    Take 1,000 mg by mouth 3 (three) times daily.    AMITRIPTYLINE (ELAVIL) 25 MG TABLET    Take 25 mg by mouth at bedtime.   ENALAPRIL (VASOTEC) 20 MG TABLET    Take 40 mg by mouth daily.   LEVOTHYROXINE (SYNTHROID, LEVOTHROID) 50 MCG TABLET    Take 50 mcg by mouth daily before breakfast.   LORAZEPAM (ATIVAN) 0.5 MG TABLET    Take one tablet by mouth every 6 hours as needed for agitation/anxiety. Take 1/2 tablet every day at 1pm   LUBIPROSTONE (AMITIZA) 24 MCG CAPSULE    Take 24 mcg by mouth 2 (two) times daily with a meal.   METOPROLOL TARTRATE (LOPRESSOR) 25 MG TABLET    Take 25 mg by mouth 2 (two) times daily.   NUTRITIONAL SUPPLEMENTS (RESOURCE 2.0 PO)    Take 90 mLs by mouth. Twice daily   OLANZAPINE (ZYPREXA) 10 MG TABLET    Take 10 mg by mouth at bedtime.   PANTOPRAZOLE (PROTONIX) 40 MG TABLET    Take 40 mg by mouth 2 (two) times daily.   POLYETHYLENE GLYCOL (MIRALAX / GLYCOLAX) PACKET    Take 17 g by mouth daily.   PRESCRIPTION MEDICATION    Apply 1 mL topically every 6 (six) hours as needed (for agitation). Lorazepam 1mg /ml gel   SENNOSIDES-DOCUSATE SODIUM (SENOKOT-S) 8.6-50 MG TABLET    Take 3 tablets by mouth daily.   Modified Medications   No medications on file  Discontinued Medications   ASPIRIN 81 MG CHEWABLE TABLET    Chew 81 mg by mouth daily.   DIGOXIN (LANOXIN) 0.125 MG TABLET    Take 0.125 mg by mouth daily. Hold for pulse >60   FUROSEMIDE (LASIX) 20  MG TABLET    Take 20 mg by mouth.   GLIMEPIRIDE (AMARYL) 2 MG TABLET    Take 2 mg by mouth daily before breakfast.     Physical Exam: Physical Exam  Constitutional: She is oriented to person, place, and time. She appears well-developed and well-nourished.  HENT:  Head: Normocephalic and atraumatic.  Eyes: Conjunctivae and EOM are normal. Pupils are equal, round, and reactive to light.  Neck: Normal range of motion. Neck supple. No JVD present. No thyromegaly present.  Cardiovascular: Normal rate and regular rhythm.   Murmur heard.  Systolic murmur is present with a grade of 3/6  3/6  Pulmonary/Chest: Effort normal and breath sounds normal. She has no  wheezes. She has no rales.  Abdominal: Soft. Bowel sounds are normal. She exhibits no shifting dullness, no distension and no mass. There is no tenderness. There is no rigidity, no rebound, no guarding, no CVA tenderness and negative Murphy's sign. No hernia.  Multiple external hemorrhoids.   Genitourinary: No vaginal discharge found.  Musculoskeletal: Normal range of motion. She exhibits no edema and no tenderness.  Leaning to her right while in w/c. The right hand grip strength 5/5 but weaker than the left.   Lymphadenopathy:    She has no cervical adenopathy.  Neurological: She is alert and oriented to person, place, and time. She has normal reflexes. No cranial nerve deficit. She exhibits normal muscle tone. Coordination normal.  Skin: No rash noted. No erythema.  Psychiatric: Her mood appears anxious (at times). Her affect is angry, labile and inappropriate. Her speech is not delayed and not slurred. She is agitated and aggressive. She is not actively hallucinating. Thought content is delusional. Thought content is not paranoid. Cognition and memory are impaired. She expresses impulsivity and inappropriate judgment. She exhibits abnormal recent memory and abnormal remote memory.    Filed Vitals:   01/04/14 1520  BP: 120/68  Pulse: 80   Temp: 97.6 F (36.4 C)  TempSrc: Tympanic  Resp: 18      Labs reviewed: Basic Metabolic Panel:  Recent Labs  01/11/13  06/14/13 07/22/13 1624 10/15/13  NA 140  < > 137 140 139  K 4.6  < > 4.3 4.0 4.3  CL  --   --   --  100  --   CO2  --   --   --  30  --   GLUCOSE  --   --   --  155*  --   BUN 14  < > 16 25* 21  CREATININE 0.8  < > 1.0 1.07 0.9  CALCIUM  --   --   --  9.6  --   TSH 1.46  --  1.03  --   --   < > = values in this interval not displayed. Liver Function Tests:  Recent Labs  06/14/13 07/22/13 1624 10/15/13  AST 10* 10 13  ALT 8 10 10   ALKPHOS 48 57 58  BILITOT  --  0.3  --   PROT  --  6.8  --   ALBUMIN  --  3.4*  --    CBC:  Recent Labs  03/05/13 06/14/13 07/22/13 1624 10/15/13  WBC 7.0 6.6 11.6* 8.3  NEUTROABS  --   --  9.8*  --   HGB 13.2 13.9 13.4 14.3  HCT 38 40 41.4 42  MCV  --   --  93.2  --   PLT 234  --  283 287   Lipid Panel:  Recent Labs  08/16/13  CHOL 218*  HDL 40  LDLCALC 151  TRIG 133    Assessment/Plan Atrial fibrillation Rate controlled, continue Metoprolol, dc Digoxin, monitor VS daily.   Degeneration of intervertebral disc, site unspecified Tylenol 500mg  tid is adequate for pain.     Depressive disorder, not elsewhere classified Mood is managed with Amitriptyline  25mg  and Zyprexa 10mg  and Ativan 0.25mg  dialy and prn.   GERD (gastroesophageal reflux disease) Stable on Protonix 40mg  bid.   Memory loss [name] in her w/c, takes zyprexa for psychosis-- Zyprexa 10mg  since 03/02/13 and Ativan 0.25mg  daily after lunch-occasional agitation has been managed with prn Ativan    Type II or unspecified type diabetes  mellitus with renal manifestations, uncontrolled(250.42) Managed with Glimepiride 2mg , Hgb A1c 6.0 01/11/13--Hgb A1c 5.9 06/14/13. Fast CBG in am <126 consistently, dc Glimepiride, continue to monitor CBG for 1 week, then re-evaluate.     Unspecified constipation Managed with Amitiza 23mcg bid, MiraLax  daily,  and Senokot S III daily   Unspecified essential hypertension Controlled on   Metoprolol 25mg  bid and Enalapril 40mg , dc Furosemide 20mg . VS daily. Update CMP    Unspecified hypothyroidism Takes Levothyroxine 22mcg,TSH 1.464 01/11/13 and 1.030 06/14/13. Update TSH   FTT (failure to thrive) in adult Consistently and gradual weight loss># 10Ibs in the past year, almost total care of her ADLs, progressive memory loss-off memory preserving meds, sad facial looks regardless mood stabilizer--will simplify medications-dc Dib, ASA, Furosemide, Glimepiride. Check CBC, CMP, and TSH.     Family/ Staff Communication: observe the patient.   Goals of Care: SNF  Labs/tests ordered: CBC, CMP, TSH

## 2014-01-04 NOTE — Assessment & Plan Note (Signed)
Wanders in her w/c, takes zyprexa for psychosis-- Zyprexa 10mg since 03/02/13 and Ativan 0.25mg daily after lunch-occasional agitation has been managed with prn Ativan                    

## 2014-01-07 LAB — BASIC METABOLIC PANEL
BUN: 25 mg/dL — AB (ref 4–21)
Creatinine: 1.2 mg/dL — AB (ref 0.5–1.1)
GLUCOSE: 109 mg/dL
POTASSIUM: 4.3 mmol/L (ref 3.4–5.3)
Sodium: 140 mmol/L (ref 137–147)

## 2014-01-07 LAB — HEPATIC FUNCTION PANEL
ALT: 8 U/L (ref 7–35)
AST: 8 U/L — AB (ref 13–35)
Alkaline Phosphatase: 54 U/L (ref 25–125)
Bilirubin, Total: 54 mg/dL

## 2014-01-07 LAB — CBC AND DIFFERENTIAL
HCT: 40 % (ref 36–46)
HEMOGLOBIN: 13.5 g/dL (ref 12.0–16.0)
Platelets: 298 10*3/uL (ref 150–399)
WBC: 7 10^3/mL

## 2014-01-07 LAB — TSH: TSH: 1.38 u[IU]/mL (ref 0.41–5.90)

## 2014-01-13 IMAGING — CT CT HEAD W/O CM
3 of 4 series · 17 of 30 positions shown, 19 images · non-contrast
Comparison: None.

CLINICAL DATA: Increased confusion

EXAM:
CT HEAD WITHOUT CONTRAST
TECHNIQUE: Contiguous axial images were obtained from the base of the skull
through the vertex without intravenous contrast. Study was obtained
within 24 hr of patient's arrival at the emergency department.

[Series 2: bone windows · axial · 0.48mm/px · z∈[+1496,+1591]mm · 5 of 29 slices shown]
[im 5/29  bone]
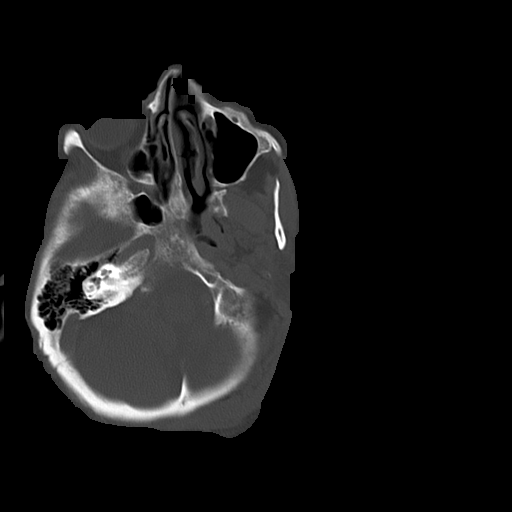
[im 10/29  bone]
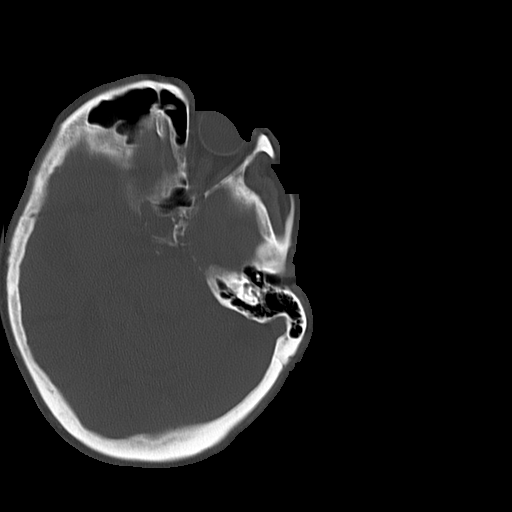
[im 15/29  bone]
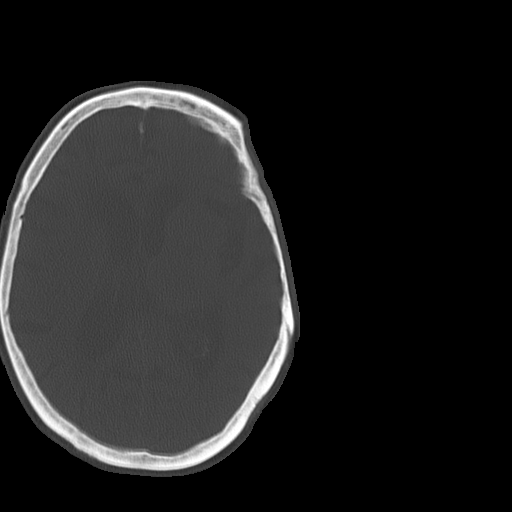
[im 19/29  bone]
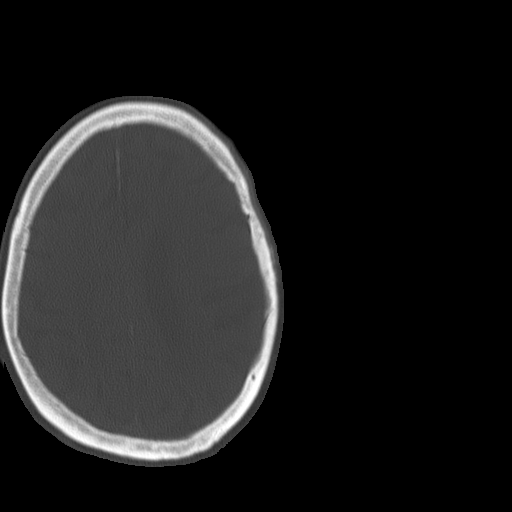
[im 24/29  bone]
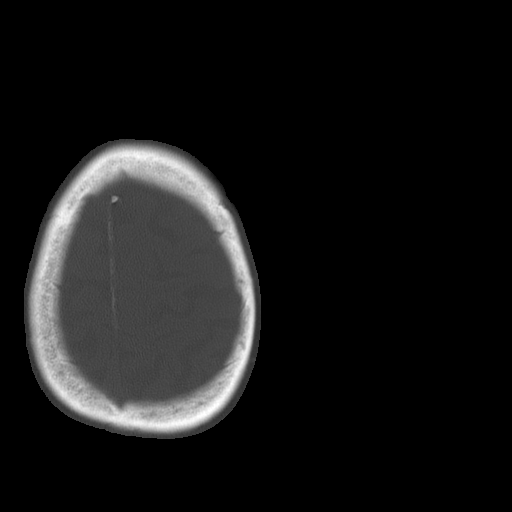

[Series 4: head w/o · axial · non-contrast · 0.48mm/px · z∈[+1496,+1596]mm · 6 of 29 slices shown, 8 images (1 of 2)]
[im 5/29  brain]
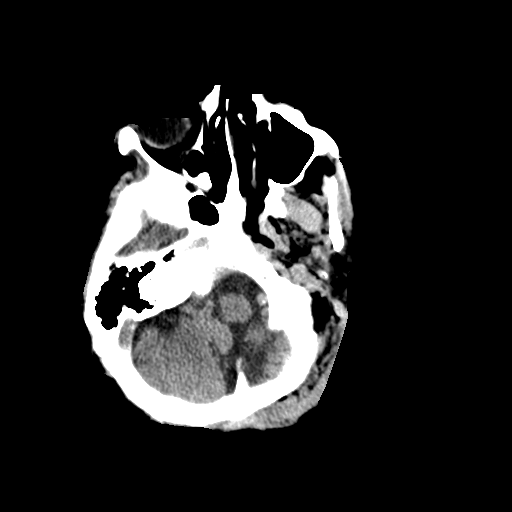
[im 5/29  bone]
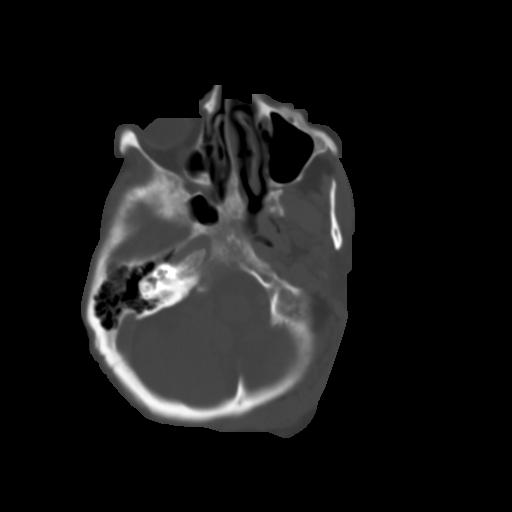
[im 9/29  brain]
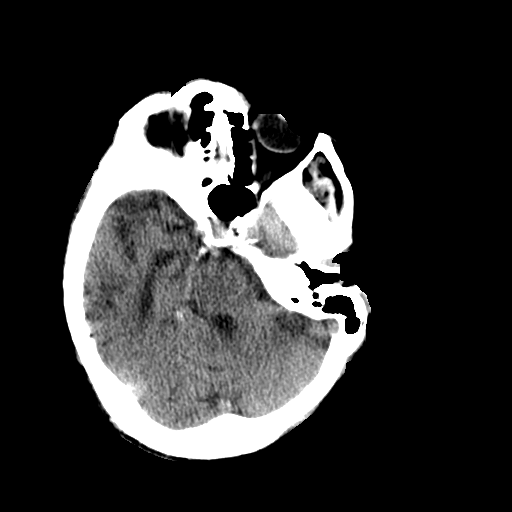
[im 13/29  brain]
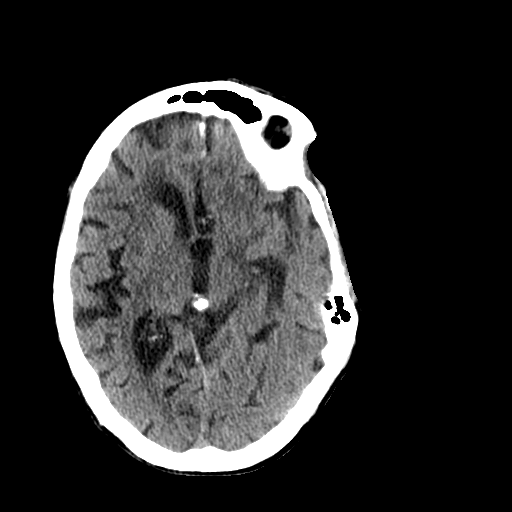
[im 17/29  brain]
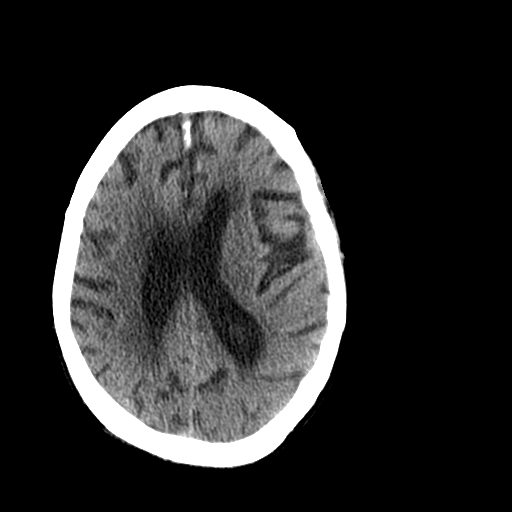
[im 21/29  brain]
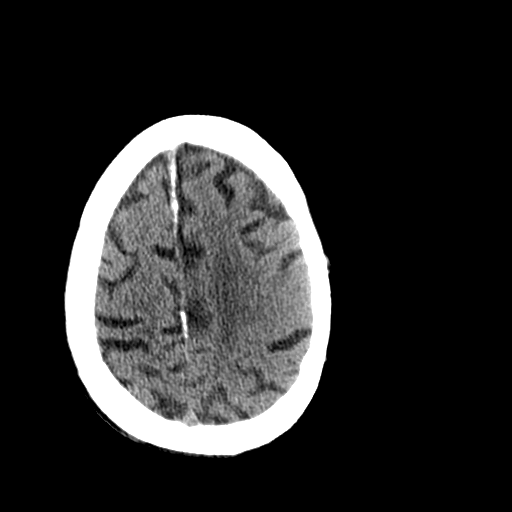
[im 21/29  bone]
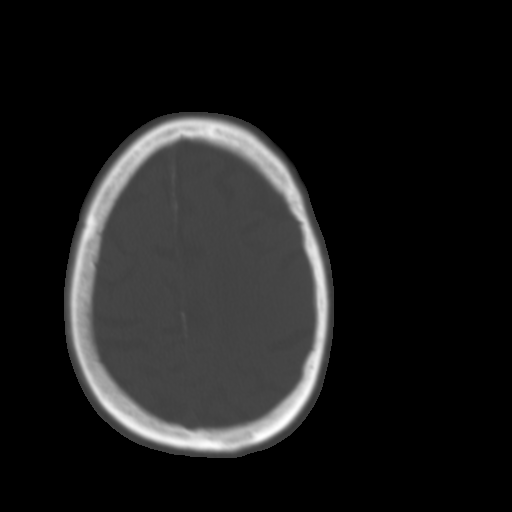
[im 25/29  brain]
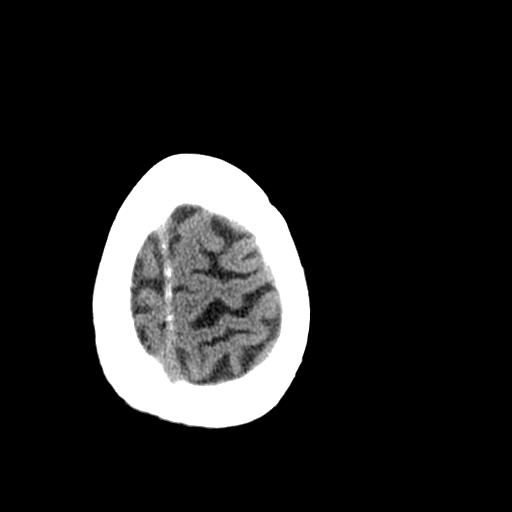

[Series 5: head w/o · axial · non-contrast · 0.48mm/px · z∈[+1496,+1596]mm · 6 of 29 slices shown (2 of 2)]
[im 5/29  brain]
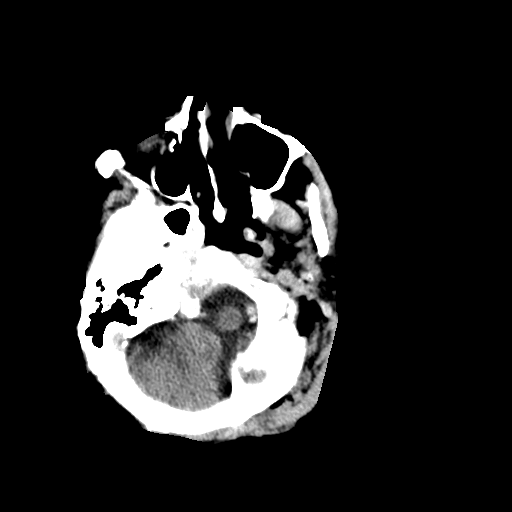
[im 9/29  brain]
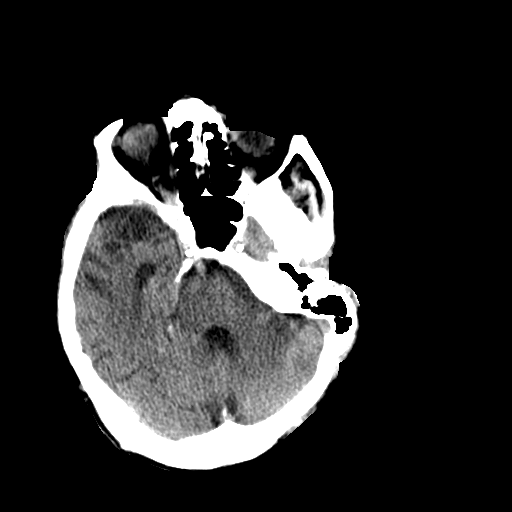
[im 13/29  brain]
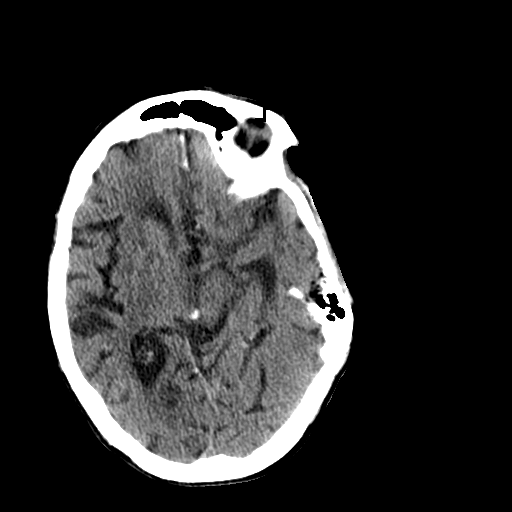
[im 17/29  brain]
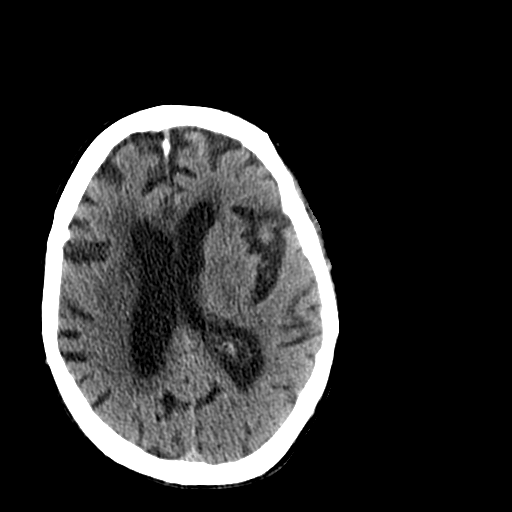
[im 21/29  brain]
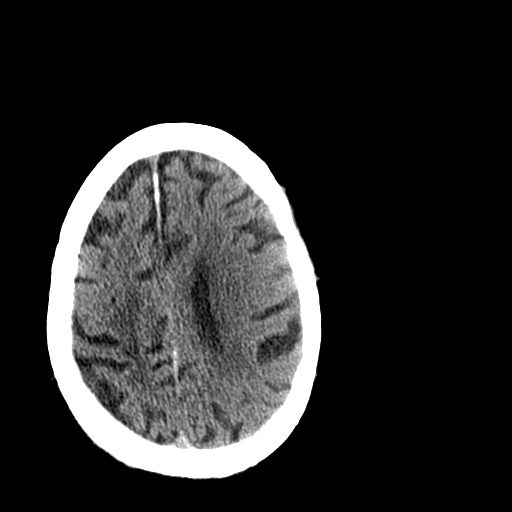
[im 25/29  brain]
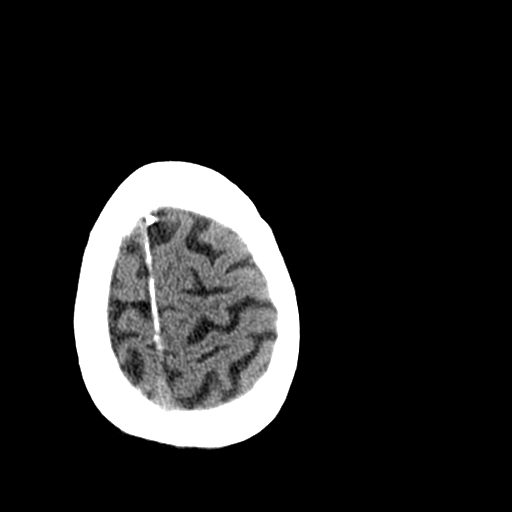

[17 of 30 positions shown; findings below may reference images not displayed]

FINDINGS: There is moderate diffuse atrophy. There is no mass, hemorrhage,
extra-axial fluid collection, or midline shift. There is small
vessel disease in the centra semiovale bilaterally. There is
decreased attenuation throughout much of the right centrum semiovale
which appears asymmetric compared to the left. This degree of
asymmetry could be indicative of recent infarct in the right centrum
semiovale. There is no decreased attenuation extending into the gray
matter in this area, however. There is evidence of a prior infarct
in the left lentiform nucleus.

Bony calvarium appears intact. The mastoid air cells are clear.
IMPRESSION: Atrophy was supratentorial small vessel disease. There is decreased
attenuation throughout much of the right centrum semiovale compared
with the left. This asymmetry raises question of acute infarct
involving this territory. However, note that there is no loss of
attenuation in the gray matter. It is possible that these changes
are entirely chronic although asymmetric.

There is evidence of a prior small infarct in the left lentiform
nucleus.

There is no hemorrhage or mass effect.

## 2014-01-13 IMAGING — CR DG CHEST 2V
3 series · 3 of 3 positions shown · non-contrast
Comparison: April 27, 2004

CLINICAL DATA: Altered mental status; hypertension

EXAM:
CHEST  2 VIEW

[w chest lat]
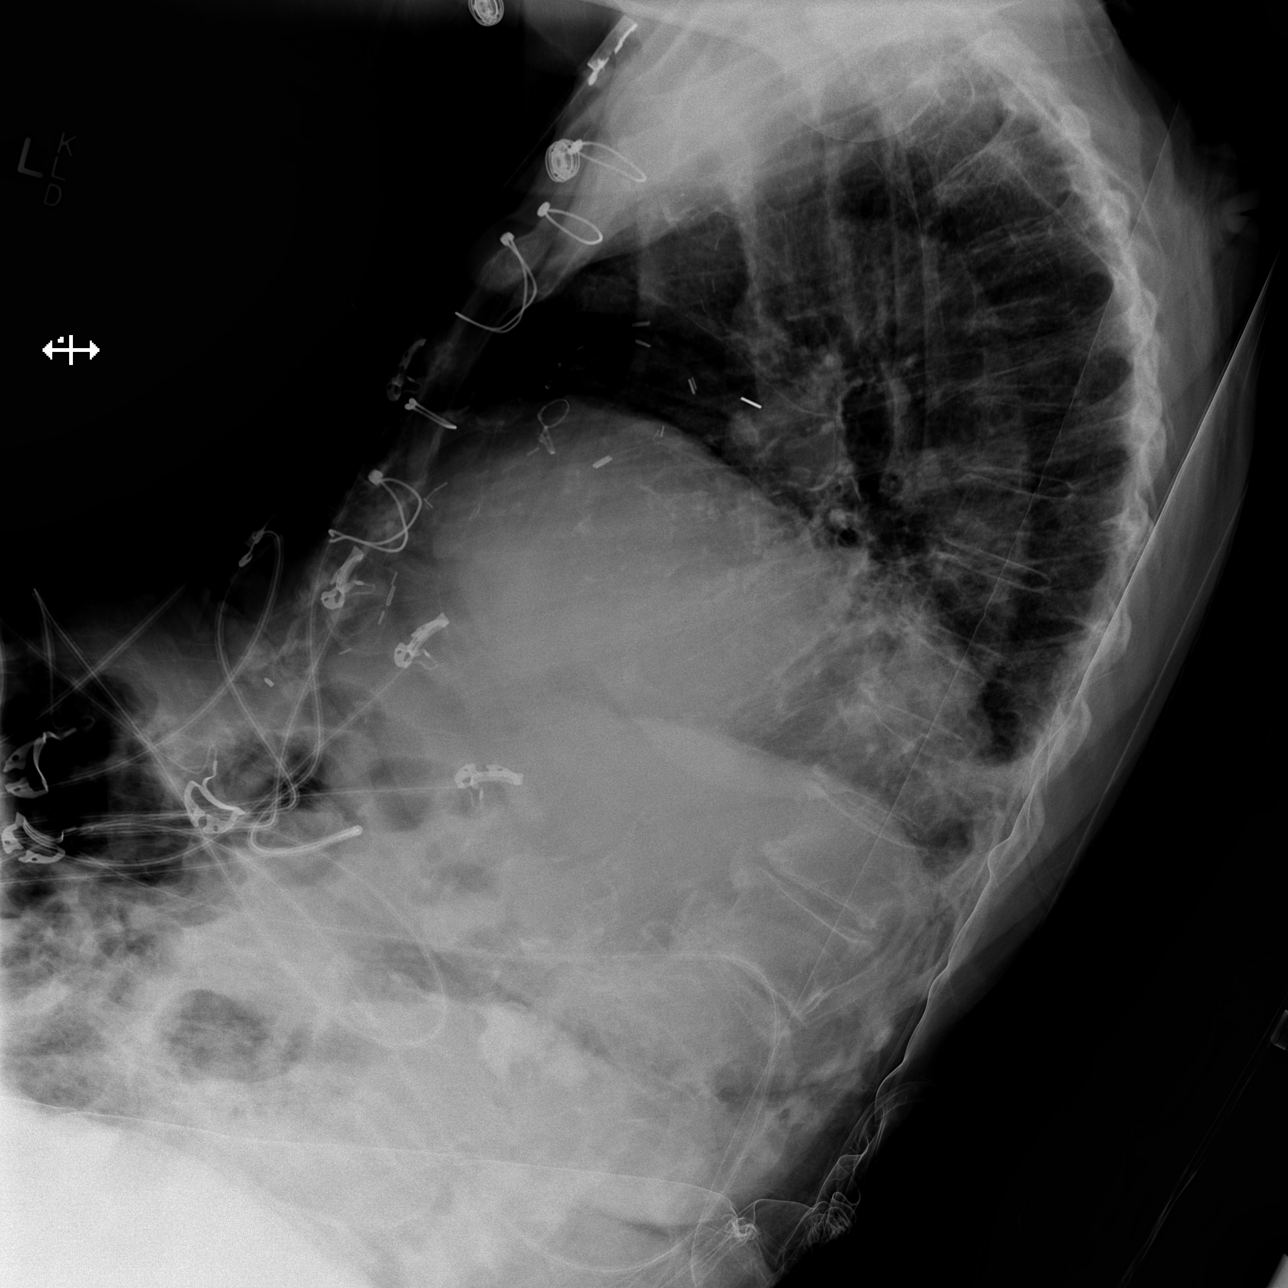

[x chest ap (1 of 2)]
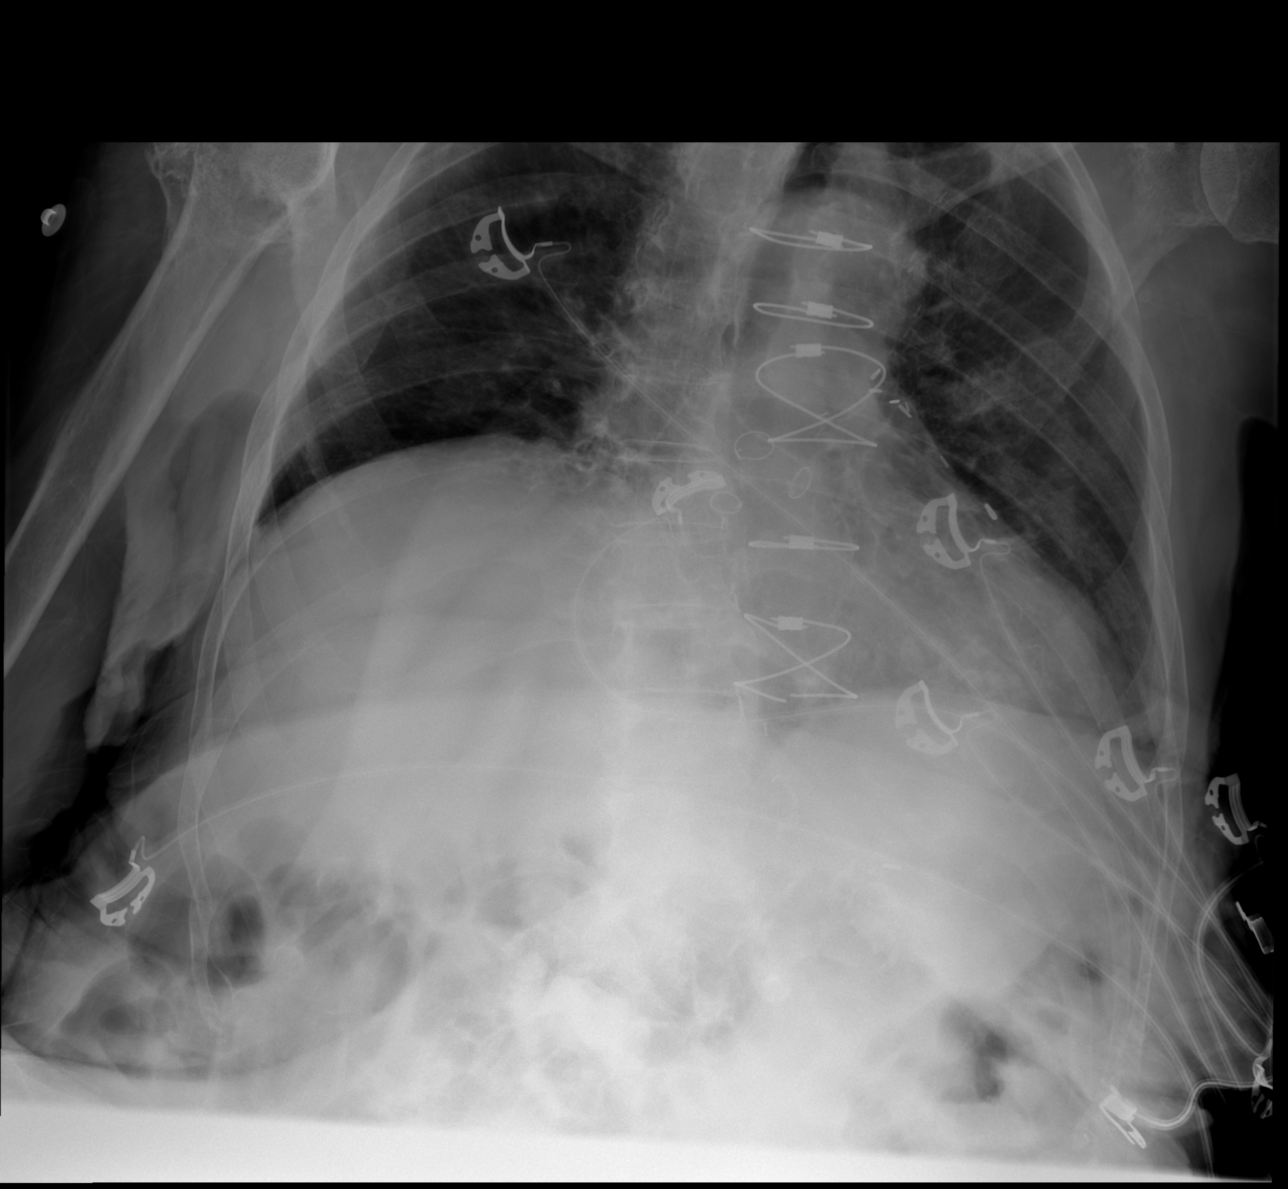

[x chest ap (2 of 2)]
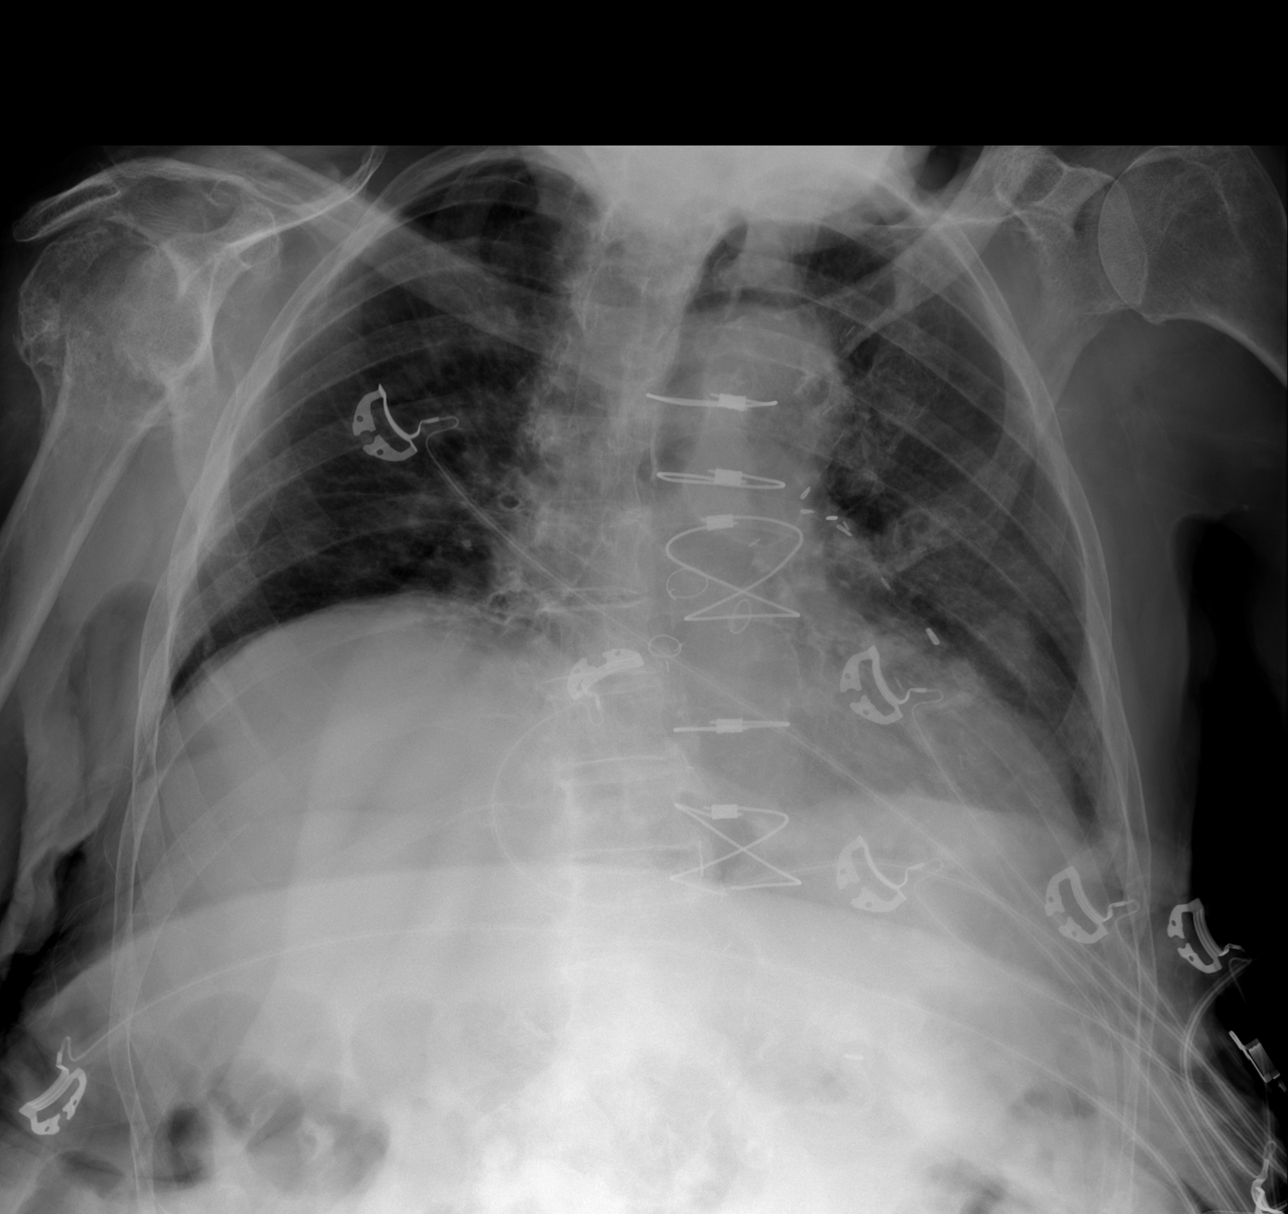

[3 of 3 positions shown; findings below may reference images not displayed]

FINDINGS: There is elevation of the right hemidiaphragm. There is no edema or
consolidation. Heart is upper normal in size with normal pulmonary
vascularity. Patient is status post coronary artery bypass grafting.
There is no adenopathy. There is a healing fracture of the proximal
right humeral metaphysis.
IMPRESSION: No edema or consolidation. Elevation of right hemidiaphragm, a
chronic finding. Healing fracture proximal right humerus.

## 2014-01-22 ENCOUNTER — Non-Acute Institutional Stay (SKILLED_NURSING_FACILITY): Payer: Medicare Other | Admitting: Nurse Practitioner

## 2014-01-22 ENCOUNTER — Encounter: Payer: Self-pay | Admitting: Nurse Practitioner

## 2014-01-22 DIAGNOSIS — I4891 Unspecified atrial fibrillation: Secondary | ICD-10-CM

## 2014-01-22 DIAGNOSIS — E1165 Type 2 diabetes mellitus with hyperglycemia: Principal | ICD-10-CM

## 2014-01-22 DIAGNOSIS — F329 Major depressive disorder, single episode, unspecified: Secondary | ICD-10-CM

## 2014-01-22 DIAGNOSIS — R413 Other amnesia: Secondary | ICD-10-CM

## 2014-01-22 DIAGNOSIS — K219 Gastro-esophageal reflux disease without esophagitis: Secondary | ICD-10-CM

## 2014-01-22 DIAGNOSIS — R627 Adult failure to thrive: Secondary | ICD-10-CM

## 2014-01-22 DIAGNOSIS — K5900 Constipation, unspecified: Secondary | ICD-10-CM

## 2014-01-22 DIAGNOSIS — E1129 Type 2 diabetes mellitus with other diabetic kidney complication: Secondary | ICD-10-CM

## 2014-01-22 DIAGNOSIS — I10 Essential (primary) hypertension: Secondary | ICD-10-CM

## 2014-01-22 DIAGNOSIS — E039 Hypothyroidism, unspecified: Secondary | ICD-10-CM

## 2014-01-22 DIAGNOSIS — F3289 Other specified depressive episodes: Secondary | ICD-10-CM

## 2014-01-22 NOTE — Progress Notes (Signed)
Patient ID: Selena Ward, female   DOB: 12-28-1917, 80 y.o.   MRN: [id number]   Code Status: DNR  Allergies  Allergen Reactions  . Niacin And Related   . Nsaids     Chief Complaint  Patient presents with  . Medical Managment of Chronic Issues  . Acute Visit    blood sugar    HPI: Patient is a 80 y.o. female seen in the SNF at [facility] today for evaluation of blood sugar and chronic medical conditions.  Problem List Items Addressed This Visit   Unspecified essential hypertension (Chronic)     Controlled on Metoprolol 25mg  bid and Enalapril 40mg , off Furosemide 20mg . Bun/creat 25/1.17 01/07/14      Unspecified hypothyroidism     Takes Levothyroxine 51mcg,TSH 1.464 01/11/13 and 1.030 06/14/13. Update TSH 1.378 01/07/14    Type II or unspecified type diabetes mellitus with renal manifestations, uncontrolled(250.42) - Primary     Managed with Glimepiride 2mg , Hgb A1c 6.0 01/11/13--Hgb A1c 5.9 06/14/13. Fast CBG in am <126 consistently, resume Glimepiride 1mg  due to still elevated CBG in am 138, 158, 163, 172, 108, 16, 136, 110. Continue to monitor CBG    Relevant Medications      glimepiride (AMARYL) 1 MG tablet   Depressive disorder, not elsewhere classified     Mood is managed with Amitriptyline  25mg  and Zyprexa 10mg  and Ativan 0.25mg  dialy and prn.      Atrial fibrillation     Rate controlled, continue Metoprolol, off Digoxin, monitor VS daily.      Unspecified constipation     Managed with Amitiza 35mcg bid, MiraLax daily,  and Senokot S III daily     Memory loss     [name] in her w/c, takes zyprexa for psychosis-- Zyprexa 10mg  since 03/02/13 and Ativan 0.25mg  daily after lunch-occasional agitation has been managed with prn Ativan     GERD (gastroesophageal reflux disease)     Stable on Protonix 40mg  bid.      FTT (failure to thrive) in adult     Consistently and gradual weight loss># 10Ibs in the past year, almost total care of her ADLs, progressive memory  loss-off memory preserving meds, sad facial looks regardless mood stabilizer--will simplify medications-off  Dib, ASA, Furosemide. Unremarkable CBC, CMP, and TSH.         Review of Systems:  Review of Systems  Constitutional: Positive for weight loss. Negative for fever, chills, malaise/fatigue and diaphoresis.  HENT: Positive for hearing loss. Negative for congestion, ear pain and sore throat.   Eyes: Negative for pain, discharge and redness.  Respiratory: Negative for cough, shortness of breath and wheezing.   Cardiovascular: Negative for chest pain, palpitations, orthopnea, claudication, leg swelling and PND.  Gastrointestinal: Negative for heartburn, nausea, vomiting, abdominal pain, diarrhea, constipation and blood in stool.  Genitourinary: Negative for dysuria, urgency, hematuria and flank pain. Frequency: incontinent of bladder.  Musculoskeletal: Positive for falls. Negative for back pain, joint pain, myalgias and neck pain.       Leaning to her right with RUE weaker than the left. Wandering and found sitting on floor in other resident's rooms--lack of safety awareness  Skin: Negative for itching and rash.  Neurological: Negative for dizziness, sensory change, speech change, focal weakness, seizures, loss of consciousness, weakness and headaches.  Endo/Heme/Allergies: Negative for environmental allergies and polydipsia. Does not bruise/bleed easily.  Psychiatric/Behavioral: Positive for memory loss. Negative for depression and hallucinations. The patient is nervous/anxious. The patient does not have  insomnia.      Past Medical History  Diagnosis Date  . Depression   . Heart murmur   . Hypertension   . Diabetes mellitus without complication   . CHF (congestive heart failure)   . Anxiety   . GERD (gastroesophageal reflux disease)   . Bilateral breast cancer 2004  . Degeneration of intervertebral disc, site unspecified   . Lumbago   . Disorder of bone and cartilage,  unspecified   . Debility, unspecified   . Senile dementia with depressive features   . Unspecified hypothyroidism   . Other and unspecified hyperlipidemia   . Atrial fibrillation   . Unspecified constipation   . Hydronephrosis   . Senile osteoporosis   . Memory loss   . Closed fracture of anatomical neck of humerus   . Hypopotassemia   . Hydronephrosis    Medications: Patient's Medications  New Prescriptions   No medications on file  Previous Medications   ACETAMINOPHEN (TYLENOL) 500 MG TABLET    Take 1,000 mg by mouth 3 (three) times daily.    AMITRIPTYLINE (ELAVIL) 25 MG TABLET    Take 25 mg by mouth at bedtime.   ENALAPRIL (VASOTEC) 20 MG TABLET    Take 40 mg by mouth daily.   GLIMEPIRIDE (AMARYL) 1 MG TABLET    Take 1 mg by mouth daily with breakfast.   LEVOTHYROXINE (SYNTHROID, LEVOTHROID) 50 MCG TABLET    Take 50 mcg by mouth daily before breakfast.   LORAZEPAM (ATIVAN) 0.5 MG TABLET    Take one tablet by mouth every 6 hours as needed for agitation/anxiety. Take 1/2 tablet every day at 1pm   LUBIPROSTONE (AMITIZA) 24 MCG CAPSULE    Take 24 mcg by mouth 2 (two) times daily with a meal.   METOPROLOL TARTRATE (LOPRESSOR) 25 MG TABLET    Take 25 mg by mouth 2 (two) times daily.   NUTRITIONAL SUPPLEMENTS (RESOURCE 2.0 PO)    Take 90 mLs by mouth. Twice daily   OLANZAPINE (ZYPREXA) 10 MG TABLET    Take 10 mg by mouth at bedtime.   PANTOPRAZOLE (PROTONIX) 40 MG TABLET    Take 40 mg by mouth 2 (two) times daily.   POLYETHYLENE GLYCOL (MIRALAX / GLYCOLAX) PACKET    Take 17 g by mouth daily.   PRESCRIPTION MEDICATION    Apply 1 mL topically every 6 (six) hours as needed (for agitation). Lorazepam 1mg /ml gel   SENNOSIDES-DOCUSATE SODIUM (SENOKOT-S) 8.6-50 MG TABLET    Take 3 tablets by mouth daily.   Modified Medications   No medications on file  Discontinued Medications   No medications on file     Physical Exam: Physical Exam  Constitutional: She is oriented to person, place,  and time. She appears well-developed and well-nourished.  HENT:  Head: Normocephalic and atraumatic.  Eyes: Conjunctivae and EOM are normal. Pupils are equal, round, and reactive to light.  Neck: Normal range of motion. Neck supple. No JVD present. No thyromegaly present.  Cardiovascular: Normal rate and regular rhythm.   Murmur heard.  Systolic murmur is present with a grade of 3/6  3/6  Pulmonary/Chest: Effort normal and breath sounds normal. She has no wheezes. She has no rales.  Abdominal: Soft. Bowel sounds are normal. She exhibits no shifting dullness, no distension and no mass. There is no tenderness. There is no rigidity, no rebound, no guarding, no CVA tenderness and negative Murphy's sign. No hernia.  Multiple external hemorrhoids.   Genitourinary: No vaginal discharge found.  Musculoskeletal: Normal range of motion. She exhibits no edema and no tenderness.  Leaning to her right while in w/c. The right hand grip strength 5/5 but weaker than the left.   Lymphadenopathy:    She has no cervical adenopathy.  Neurological: She is alert and oriented to person, place, and time. She has normal reflexes. No cranial nerve deficit. She exhibits normal muscle tone. Coordination normal.  Skin: No rash noted. No erythema.  Psychiatric: Her mood appears anxious (at times). Her affect is angry, labile and inappropriate. Her speech is not delayed and not slurred. She is agitated and aggressive. She is not actively hallucinating. Thought content is delusional. Thought content is not paranoid. Cognition and memory are impaired. She expresses impulsivity and inappropriate judgment. She exhibits abnormal recent memory and abnormal remote memory.    Filed Vitals:   01/22/14 1518  BP: 138/79  Pulse: 83  Temp: 97.6 F (36.4 C)  TempSrc: Tympanic  Resp: 18   Labs reviewed: Basic Metabolic Panel:  Recent Labs  06/14/13 07/22/13 1624 10/15/13 01/07/14  NA 137 140 139 140  K 4.3 4.0 4.3 4.3  CL   --  100  --   --   CO2  --  30  --   --   GLUCOSE  --  155*  --   --   BUN 16 25* 21 25*  CREATININE 1.0 1.07 0.9 1.2*  CALCIUM  --  9.6  --   --   TSH 1.03  --   --  1.38   Liver Function Tests:  Recent Labs  07/22/13 1624 10/15/13 01/07/14  AST 10 13 8*  ALT 10 10 8   ALKPHOS 57 58 54  BILITOT 0.3  --   --   PROT 6.8  --   --   ALBUMIN 3.4*  --   --    CBC:  Recent Labs  07/22/13 1624 10/15/13 01/07/14  WBC 11.6* 8.3 7.0  NEUTROABS 9.8*  --   --   HGB 13.4 14.3 13.5  HCT 41.4 42 40  MCV 93.2  --   --   PLT 283 287 298   Lipid Panel:  Recent Labs  08/16/13  CHOL 218*  HDL 40  LDLCALC 151  TRIG 133    Assessment/Plan Type II or unspecified type diabetes mellitus with renal manifestations, uncontrolled(250.42) Managed with Glimepiride 2mg , Hgb A1c 6.0 01/11/13--Hgb A1c 5.9 06/14/13. Fast CBG in am <126 consistently, resume Glimepiride 1mg  due to still elevated CBG in am 138, 158, 163, 172, 108, 16, 136, 110. Continue to monitor CBG  Unspecified hypothyroidism Takes Levothyroxine 22mcg,TSH 1.464 01/11/13 and 1.030 06/14/13. Update TSH 1.378 01/07/14  Unspecified essential hypertension Controlled on Metoprolol 25mg  bid and Enalapril 40mg , off Furosemide 20mg . Bun/creat 25/1.17 01/07/14    Unspecified constipation Managed with Amitiza 31mcg bid, MiraLax daily,  and Senokot S III daily   Memory loss Wanders in her w/c, takes zyprexa for psychosis-- Zyprexa 10mg  since 03/02/13 and Ativan 0.25mg  daily after lunch-occasional agitation has been managed with prn Ativan   GERD (gastroesophageal reflux disease) Stable on Protonix 40mg  bid.    FTT (failure to thrive) in adult Consistently and gradual weight loss># 10Ibs in the past year, almost total care of her ADLs, progressive memory loss-off memory preserving meds, sad facial looks regardless mood stabilizer--will simplify medications-off  Dib, ASA, Furosemide. Unremarkable CBC, CMP, and TSH.    Depressive disorder,  not elsewhere classified Mood is managed with Amitriptyline  25mg  and Zyprexa  10mg  and Ativan 0.25mg  dialy and prn.    Atrial fibrillation Rate controlled, continue Metoprolol, off Digoxin, monitor VS daily.      Family/ Staff Communication: observe the patient.   Goals of Care: SNF  Labs/tests ordered: none.

## 2014-01-23 NOTE — Assessment & Plan Note (Signed)
Stable on Protonix 40mg bid.   

## 2014-01-23 NOTE — Assessment & Plan Note (Signed)
Mood is managed with Amitriptyline  25mg  and Zyprexa 10mg  and Ativan 0.25mg  dialy and prn.

## 2014-01-23 NOTE — Assessment & Plan Note (Signed)
Controlled on Metoprolol 25mg bid and Enalapril 40mg, off Furosemide 20mg. Bun/creat 25/1.17 01/07/14  

## 2014-01-23 NOTE — Assessment & Plan Note (Signed)
Takes Levothyroxine 50mcg,TSH 1.464 01/11/13 and 1.030 06/14/13. Update TSH 1.378 01/07/14

## 2014-01-23 NOTE — Assessment & Plan Note (Signed)
Rate controlled, continue Metoprolol, off Digoxin, monitor VS daily.   

## 2014-01-23 NOTE — Assessment & Plan Note (Signed)
Managed with Amitiza 24mcg bid, MiraLax daily,  and Senokot S III daily   

## 2014-01-23 NOTE — Assessment & Plan Note (Signed)
Consistently and gradual weight loss># 10Ibs in the past year, almost total care of her ADLs, progressive memory loss-off memory preserving meds, sad facial looks regardless mood stabilizer--will simplify medications-off  Dib, ASA, Furosemide. Unremarkable CBC, CMP, and TSH.

## 2014-01-23 NOTE — Assessment & Plan Note (Signed)
Wanders in her w/c, takes zyprexa for psychosis-- Zyprexa 10mg  since 03/02/13 and Ativan 0.25mg  daily after lunch-occasional agitation has been managed with prn Ativan

## 2014-01-23 NOTE — Assessment & Plan Note (Signed)
Managed with Glimepiride 2mg , Hgb A1c 6.0 01/11/13--Hgb A1c 5.9 06/14/13. Fast CBG in am <126 consistently, resume Glimepiride 1mg  due to still elevated CBG in am 138, 158, 163, 172, 108, 16, 136, 110. Continue to monitor CBG

## 2014-02-12 ENCOUNTER — Encounter: Payer: Self-pay | Admitting: Nurse Practitioner

## 2014-02-12 DIAGNOSIS — F29 Unspecified psychosis not due to a substance or known physiological condition: Secondary | ICD-10-CM

## 2014-02-12 DIAGNOSIS — K219 Gastro-esophageal reflux disease without esophagitis: Secondary | ICD-10-CM

## 2014-02-12 MED ORDER — PANTOPRAZOLE SODIUM 40 MG PO TBEC: 40.0000 mg | DELAYED_RELEASE_TABLET | Freq: Two times a day (BID) | ORAL | Status: DC

## 2014-02-15 ENCOUNTER — Encounter: Payer: Self-pay | Admitting: Nurse Practitioner

## 2014-02-15 ENCOUNTER — Non-Acute Institutional Stay (SKILLED_NURSING_FACILITY): Payer: Medicare Other | Admitting: Nurse Practitioner

## 2014-02-15 DIAGNOSIS — I10 Essential (primary) hypertension: Secondary | ICD-10-CM

## 2014-02-15 DIAGNOSIS — F329 Major depressive disorder, single episode, unspecified: Secondary | ICD-10-CM

## 2014-02-15 DIAGNOSIS — I4891 Unspecified atrial fibrillation: Secondary | ICD-10-CM

## 2014-02-15 DIAGNOSIS — E1129 Type 2 diabetes mellitus with other diabetic kidney complication: Secondary | ICD-10-CM

## 2014-02-15 DIAGNOSIS — L309 Dermatitis, unspecified: Secondary | ICD-10-CM | POA: Insufficient documentation

## 2014-02-15 DIAGNOSIS — L259 Unspecified contact dermatitis, unspecified cause: Secondary | ICD-10-CM

## 2014-02-15 DIAGNOSIS — R413 Other amnesia: Secondary | ICD-10-CM

## 2014-02-15 DIAGNOSIS — K5900 Constipation, unspecified: Secondary | ICD-10-CM

## 2014-02-15 DIAGNOSIS — E039 Hypothyroidism, unspecified: Secondary | ICD-10-CM

## 2014-02-15 DIAGNOSIS — E1165 Type 2 diabetes mellitus with hyperglycemia: Secondary | ICD-10-CM

## 2014-02-15 DIAGNOSIS — F3289 Other specified depressive episodes: Secondary | ICD-10-CM

## 2014-02-15 DIAGNOSIS — K219 Gastro-esophageal reflux disease without esophagitis: Secondary | ICD-10-CM

## 2014-02-15 NOTE — Assessment & Plan Note (Signed)
Takes Levothyroxine 50mcg,TSH 1.378 01/07/14   

## 2014-02-15 NOTE — Assessment & Plan Note (Signed)
Stable on Protonix 40mg bid.   

## 2014-02-15 NOTE — Assessment & Plan Note (Signed)
Wanders in her w/c, takes zyprexa for psychosis-- Zyprexa 10mg  since 03/02/13-reduced to 7.5mg  4/14/15and Ativan 0.25mg  daily after lunch-occasional agitation has been managed with prn Ativan

## 2014-02-15 NOTE — Progress Notes (Signed)
Patient ID: Selena Ward, female   DOB: [birth year], 80 y.o.   MRN: [id number]   Code Status: DNR  Allergies  Allergen Reactions  . Niacin And Related   . Nsaids     Chief Complaint  Patient presents with  . Medical Managment of Chronic Issues  . Acute Visit    rash    HPI: Patient is a 80 y.o. female seen in the SNF at [facility] today for evaluation of rash and chronic medical conditions.  Problem List Items Addressed This Visit   Atrial fibrillation     Rate controlled, continue Metoprolol, off Digoxin, monitor VS daily.      Depressive disorder, not elsewhere classified     Mood is managed with Amitriptyline  25mg  and Zyprexa 10mg  and Ativan 0.25mg  dialy and prn. Zyprexa was decreased to 7.5mg  02/12/14-will monitor for return of targeted symptoms.      Dermatitis - Primary     Noted bumps upper chest/back, shoulders and thighs-will apply Mycolog II bid until healed.     GERD (gastroesophageal reflux disease)     Stable on Protonix 40mg  bid.       Memory loss     Selena Ward in her w/c, takes zyprexa for psychosis-- Zyprexa 10mg  since 03/02/13-reduced to 7.5mg  4/14/15and Ativan 0.25mg  daily after lunch-occasional agitation has been managed with prn Ativan     Type II or unspecified type diabetes mellitus with renal manifestations, uncontrolled(250.42)     Managed with Glimepiride 1mg . Continue to monitor CBG     Unspecified constipation     Managed with Amitiza 66mcg bid, MiraLax daily,  and Senokot S III daily     Unspecified essential hypertension (Chronic)     Controlled on Metoprolol 25mg  bid and Enalapril 40mg , off Furosemide 20mg . Bun/creat 25/1.17 01/07/14       Unspecified hypothyroidism     Takes Levothyroxine 53mcg,TSH 1.378 01/07/14        Review of Systems:  Review of Systems  Constitutional: Positive for weight loss. Negative for fever, chills, malaise/fatigue and diaphoresis.  HENT: Positive for hearing loss. Negative for congestion, ear  pain and sore throat.   Eyes: Negative for pain, discharge and redness.  Respiratory: Negative for cough, shortness of breath and wheezing.   Cardiovascular: Negative for chest pain, palpitations, orthopnea, claudication, leg swelling and PND.  Gastrointestinal: Negative for heartburn, nausea, vomiting, abdominal pain, diarrhea, constipation and blood in stool.  Genitourinary: Negative for dysuria, urgency, hematuria and flank pain. Frequency: incontinent of bladder.  Musculoskeletal: Positive for falls. Negative for back pain, joint pain, myalgias and neck pain.       Leaning to her right with RUE weaker than the left. Wandering and found sitting on floor in other resident's rooms--lack of safety awareness  Skin: Positive for rash. Negative for itching.       Bumps noted upper chest, upper back, shoulders, thighs  Neurological: Negative for dizziness, sensory change, speech change, focal weakness, seizures, loss of consciousness, weakness and headaches.  Endo/Heme/Allergies: Negative for environmental allergies and polydipsia. Does not bruise/bleed easily.  Psychiatric/Behavioral: Positive for memory loss. Negative for depression and hallucinations. The patient is nervous/anxious. The patient does not have insomnia.      Past Medical History  Diagnosis Date  . Depression   . Heart murmur   . Hypertension   . Diabetes mellitus without complication   . CHF (congestive heart failure)   . Anxiety   . GERD (gastroesophageal reflux disease)   . Bilateral  breast cancer 2004  . Degeneration of intervertebral disc, site unspecified   . Lumbago   . Disorder of bone and cartilage, unspecified   . Debility, unspecified   . Senile dementia with depressive features   . Unspecified hypothyroidism   . Other and unspecified hyperlipidemia   . Atrial fibrillation   . Unspecified constipation   . Hydronephrosis   . Senile osteoporosis   . Memory loss   . Closed fracture of anatomical neck of  humerus   . Hypopotassemia   . Hydronephrosis    Medications: Patient's Medications  New Prescriptions   No medications on file  Previous Medications   ACETAMINOPHEN (TYLENOL) 500 MG TABLET    Take 1,000 mg by mouth 3 (three) times daily.    AMITRIPTYLINE (ELAVIL) 25 MG TABLET    Take 25 mg by mouth at bedtime.   ENALAPRIL (VASOTEC) 20 MG TABLET    Take 40 mg by mouth daily.   GLIMEPIRIDE (AMARYL) 1 MG TABLET    Take 1 mg by mouth daily with breakfast.   LEVOTHYROXINE (SYNTHROID, LEVOTHROID) 50 MCG TABLET    Take 50 mcg by mouth daily before breakfast.   LORAZEPAM (ATIVAN) 0.5 MG TABLET    Take one tablet by mouth every 6 hours as needed for agitation/anxiety. Take 1/2 tablet every day at 1pm   LUBIPROSTONE (AMITIZA) 24 MCG CAPSULE    Take 24 mcg by mouth 2 (two) times daily with a meal.   METOPROLOL TARTRATE (LOPRESSOR) 25 MG TABLET    Take 25 mg by mouth 2 (two) times daily.   NUTRITIONAL SUPPLEMENTS (RESOURCE 2.0 PO)    Take 90 mLs by mouth. Twice daily   OLANZAPINE (ZYPREXA) 10 MG TABLET    Take 1 tablet (10 mg total) by mouth at bedtime.   PANTOPRAZOLE (PROTONIX) 40 MG TABLET    Take 1 tablet (40 mg total) by mouth 2 (two) times daily.   POLYETHYLENE GLYCOL (MIRALAX / GLYCOLAX) PACKET    Take 17 g by mouth daily.   PRESCRIPTION MEDICATION    Apply 1 mL topically every 6 (six) hours as needed (for agitation). Lorazepam 1mg /ml gel   SENNOSIDES-DOCUSATE SODIUM (SENOKOT-S) 8.6-50 MG TABLET    Take 3 tablets by mouth daily.   Modified Medications   No medications on file  Discontinued Medications   No medications on file     Physical Exam: Physical Exam  Constitutional: She is oriented to person, place, and time. She appears well-developed and well-nourished.  HENT:  Head: Normocephalic and atraumatic.  Eyes: Conjunctivae and EOM are normal. Pupils are equal, round, and reactive to light.  Neck: Normal range of motion. Neck supple. No JVD present. No thyromegaly present.    Cardiovascular: Normal rate and regular rhythm.   Murmur heard.  Systolic murmur is present with a grade of 3/6  3/6  Pulmonary/Chest: Effort normal and breath sounds normal. She has no wheezes. She has no rales.  Abdominal: Soft. Bowel sounds are normal. She exhibits no shifting dullness, no distension and no mass. There is no tenderness. There is no rigidity, no rebound, no guarding, no CVA tenderness and negative Murphy's sign. No hernia.  Multiple external hemorrhoids.   Genitourinary: No vaginal discharge found.  Musculoskeletal: Normal range of motion. She exhibits no edema and no tenderness.  Leaning to her right while in w/c. The right hand grip strength 5/5 but weaker than the left.   Lymphadenopathy:    She has no cervical adenopathy.  Neurological: She  is alert and oriented to person, place, and time. She has normal reflexes. No cranial nerve deficit. She exhibits normal muscle tone. Coordination normal.  Skin: Rash noted. No erythema.  Bumps noted upper chest, upper back, shoulders, thighs.   Psychiatric: Her mood appears anxious (at times). Her affect is angry, labile and inappropriate. Her speech is not delayed and not slurred. She is agitated and aggressive. She is not actively hallucinating. Thought content is delusional. Thought content is not paranoid. Cognition and memory are impaired. She expresses impulsivity and inappropriate judgment. She exhibits abnormal recent memory and abnormal remote memory.    Filed Vitals:   02/15/14 1238  BP: 116/64  Pulse: 66  Temp: 97.2 F (36.2 C)  TempSrc: Tympanic  Resp: 18   Labs reviewed: Basic Metabolic Panel:  Recent Labs  06/14/13 07/22/13 1624 10/15/13 01/07/14  NA 137 140 139 140  K 4.3 4.0 4.3 4.3  CL  --  100  --   --   CO2  --  30  --   --   GLUCOSE  --  155*  --   --   BUN 16 25* 21 25*  CREATININE 1.0 1.07 0.9 1.2*  CALCIUM  --  9.6  --   --   TSH 1.03  --   --  1.38   Liver Function Tests:  Recent Labs   07/22/13 1624 10/15/13 01/07/14  AST 10 13 8*  ALT 10 10 8   ALKPHOS 57 58 54  BILITOT 0.3  --   --   PROT 6.8  --   --   ALBUMIN 3.4*  --   --    CBC:  Recent Labs  07/22/13 1624 10/15/13 01/07/14  WBC 11.6* 8.3 7.0  NEUTROABS 9.8*  --   --   HGB 13.4 14.3 13.5  HCT 41.4 42 40  MCV 93.2  --   --   PLT 283 287 298   Lipid Panel:  Recent Labs  08/16/13  CHOL 218*  HDL 40  LDLCALC 151  TRIG 133    Assessment/Plan Dermatitis Noted bumps upper chest/back, shoulders and thighs-will apply Mycolog II bid until healed.   Depressive disorder, not elsewhere classified Mood is managed with Amitriptyline  25mg  and Zyprexa 10mg  and Ativan 0.25mg  dialy and prn. Zyprexa was decreased to 7.5mg  02/12/14-will monitor for return of targeted symptoms.    Unspecified essential hypertension Controlled on Metoprolol 25mg  bid and Enalapril 40mg , off Furosemide 20mg . Bun/creat 25/1.17 01/07/14     Type II or unspecified type diabetes mellitus with renal manifestations, uncontrolled(250.42) Managed with Glimepiride 1mg . Continue to monitor CBG   Unspecified hypothyroidism Takes Levothyroxine 90mcg,TSH 1.378 01/07/14   Unspecified constipation Managed with Amitiza 33mcg bid, MiraLax daily,  and Senokot S III daily   Memory loss Wanders in her w/c, takes zyprexa for psychosis-- Zyprexa 10mg  since 03/02/13-reduced to 7.5mg  4/14/15and Ativan 0.25mg  daily after lunch-occasional agitation has been managed with prn Ativan   GERD (gastroesophageal reflux disease) Stable on Protonix 40mg  bid.     Atrial fibrillation Rate controlled, continue Metoprolol, off Digoxin, monitor VS daily.      Family/ Staff Communication: observe the patient.   Goals of Care: SNF  Labs/tests ordered: none.

## 2014-02-15 NOTE — Assessment & Plan Note (Signed)
Mood is managed with Amitriptyline  25mg  and Zyprexa 10mg  and Ativan 0.25mg  dialy and prn. Zyprexa was decreased to 7.5mg  02/12/14-will monitor for return of targeted symptoms.

## 2014-02-15 NOTE — Assessment & Plan Note (Signed)
Noted bumps upper chest/back, shoulders and thighs-will apply Mycolog II bid until healed.

## 2014-02-15 NOTE — Assessment & Plan Note (Signed)
Managed with Glimepiride 1mg. Continue to monitor CBG   

## 2014-02-15 NOTE — Assessment & Plan Note (Signed)
Controlled on Metoprolol 25mg bid and Enalapril 40mg, off Furosemide 20mg. Bun/creat 25/1.17 01/07/14  

## 2014-02-15 NOTE — Assessment & Plan Note (Signed)
Managed with Amitiza 24mcg bid, MiraLax daily,  and Senokot S III daily   

## 2014-02-15 NOTE — Assessment & Plan Note (Signed)
Rate controlled, continue Metoprolol, off Digoxin, monitor VS daily.

## 2014-03-08 ENCOUNTER — Encounter: Payer: Self-pay | Admitting: Nurse Practitioner

## 2014-03-08 ENCOUNTER — Non-Acute Institutional Stay (SKILLED_NURSING_FACILITY): Payer: Medicare Other | Admitting: Nurse Practitioner

## 2014-03-08 DIAGNOSIS — E1165 Type 2 diabetes mellitus with hyperglycemia: Secondary | ICD-10-CM

## 2014-03-08 DIAGNOSIS — F3289 Other specified depressive episodes: Secondary | ICD-10-CM

## 2014-03-08 DIAGNOSIS — E039 Hypothyroidism, unspecified: Secondary | ICD-10-CM

## 2014-03-08 DIAGNOSIS — K5900 Constipation, unspecified: Secondary | ICD-10-CM

## 2014-03-08 DIAGNOSIS — R413 Other amnesia: Secondary | ICD-10-CM

## 2014-03-08 DIAGNOSIS — F329 Major depressive disorder, single episode, unspecified: Secondary | ICD-10-CM

## 2014-03-08 DIAGNOSIS — I10 Essential (primary) hypertension: Secondary | ICD-10-CM

## 2014-03-08 DIAGNOSIS — E1129 Type 2 diabetes mellitus with other diabetic kidney complication: Secondary | ICD-10-CM

## 2014-03-08 DIAGNOSIS — I4891 Unspecified atrial fibrillation: Secondary | ICD-10-CM

## 2014-03-08 DIAGNOSIS — K219 Gastro-esophageal reflux disease without esophagitis: Secondary | ICD-10-CM

## 2014-03-08 DIAGNOSIS — IMO0002 Reserved for concepts with insufficient information to code with codable children: Secondary | ICD-10-CM

## 2014-03-08 NOTE — Assessment & Plan Note (Signed)
Wanders in her w/c, takes zyprexa for psychosis-- Zyprexa 10mg since 03/02/13-reduced to 7.5mg 4/14/15and Ativan 0.25mg daily after lunch-occasional agitation has been managed with prn Ativan  

## 2014-03-08 NOTE — Assessment & Plan Note (Signed)
Mood is managed with Amitriptyline  25mg and Zyprexa 10mg and Ativan 0.25mg dialy and prn. Zyprexa was decreased to 7.5mg 02/12/14-will monitor for return of targeted symptoms.   

## 2014-03-08 NOTE — Assessment & Plan Note (Signed)
Managed with Glimepiride 1mg . Continue to monitor CBG

## 2014-03-08 NOTE — Assessment & Plan Note (Signed)
Rate controlled, continue Metoprolol, off Digoxin, monitor HR  

## 2014-03-08 NOTE — Progress Notes (Signed)
Patient ID: Selena Ward, female   DOB: Oct 07, 1918, 80 y.o.   MRN: [id number]   Code Status: DNR  Allergies  Allergen Reactions  . Niacin And Related   . Nsaids     Chief Complaint  Patient presents with  . Medical Management of Chronic Issues    HPI: Patient is a 80 y.o. female seen in the SNF at [facility] today for evaluation of chronic medical conditions.  Problem List Items Addressed This Visit   Atrial fibrillation     Rate controlled, continue Metoprolol, off Digoxin, monitor HR    Degeneration of intervertebral disc, site unspecified     Tylenol 500mg  tid is adequate for pain.       Depressive disorder, not elsewhere classified     Mood is managed with Amitriptyline  25mg  and Zyprexa 10mg  and Ativan 0.25mg  dialy and prn. Zyprexa was decreased to 7.5mg  02/12/14-will monitor for return of targeted symptoms.       GERD (gastroesophageal reflux disease)     Stable on Protonix 40mg  bid.        Memory loss     [name] in her w/c, takes zyprexa for psychosis-- Zyprexa 10mg  since 03/02/13-reduced to 7.5mg  4/14/15and Ativan 0.25mg  daily after lunch-occasional agitation has been managed with prn Ativan      Type II or unspecified type diabetes mellitus with renal manifestations, uncontrolled(250.42) - Primary     Managed with Glimepiride 1mg . Continue to monitor CBG      Unspecified constipation     Managed with Amitiza 46mcg bid, MiraLax daily,  and Senokot S III daily      Unspecified essential hypertension (Chronic)     Controlled on Metoprolol 25mg  bid and Enalapril 40mg , off Furosemide 20mg . Bun/creat 25/1.17 01/07/14      Unspecified hypothyroidism     Takes Levothyroxine 52mcg,TSH 1.378 01/07/14       Review of Systems:  Review of Systems  Constitutional: Positive for weight loss. Negative for fever, chills, malaise/fatigue and diaphoresis.  HENT: Positive for hearing loss. Negative for congestion, ear pain and sore throat.   Eyes: Negative for  pain, discharge and redness.  Respiratory: Negative for cough, shortness of breath and wheezing.   Cardiovascular: Negative for chest pain, palpitations, orthopnea, claudication, leg swelling and PND.  Gastrointestinal: Negative for heartburn, nausea, vomiting, abdominal pain, diarrhea, constipation and blood in stool.  Genitourinary: Negative for dysuria, urgency, hematuria and flank pain. Frequency: incontinent of bladder.  Musculoskeletal: Positive for falls. Negative for back pain, joint pain, myalgias and neck pain.       Leaning to her right with RUE weaker than the left. Wandering and found sitting on floor in other resident's rooms--lack of safety awareness  Skin: Positive for rash. Negative for itching.       Bumps noted upper chest, upper back, shoulders, thighs  Neurological: Negative for dizziness, sensory change, speech change, focal weakness, seizures, loss of consciousness, weakness and headaches.  Endo/Heme/Allergies: Negative for environmental allergies and polydipsia. Does not bruise/bleed easily.  Psychiatric/Behavioral: Positive for memory loss. Negative for depression and hallucinations. The patient is nervous/anxious. The patient does not have insomnia.      Past Medical History  Diagnosis Date  . Depression   . Heart murmur   . Hypertension   . Diabetes mellitus without complication   . CHF (congestive heart failure)   . Anxiety   . GERD (gastroesophageal reflux disease)   . Bilateral breast cancer 2004  . Degeneration of intervertebral disc,  site unspecified   . Lumbago   . Disorder of bone and cartilage, unspecified   . Debility, unspecified   . Senile dementia with depressive features   . Unspecified hypothyroidism   . Other and unspecified hyperlipidemia   . Atrial fibrillation   . Unspecified constipation   . Hydronephrosis   . Senile osteoporosis   . Memory loss   . Closed fracture of anatomical neck of humerus   . Hypopotassemia   . Hydronephrosis     Medications: Patient's Medications  New Prescriptions   No medications on file  Previous Medications   ACETAMINOPHEN (TYLENOL) 500 MG TABLET    Take 1,000 mg by mouth 3 (three) times daily.    AMITRIPTYLINE (ELAVIL) 25 MG TABLET    Take 25 mg by mouth at bedtime.   ENALAPRIL (VASOTEC) 20 MG TABLET    Take 40 mg by mouth daily.   GLIMEPIRIDE (AMARYL) 1 MG TABLET    Take 1 mg by mouth daily with breakfast.   LEVOTHYROXINE (SYNTHROID, LEVOTHROID) 50 MCG TABLET    Take 50 mcg by mouth daily before breakfast.   LORAZEPAM (ATIVAN) 0.5 MG TABLET    Take one tablet by mouth every 6 hours as needed for agitation/anxiety. Take 1/2 tablet every day at 1pm   LUBIPROSTONE (AMITIZA) 24 MCG CAPSULE    Take 24 mcg by mouth 2 (two) times daily with a meal.   METOPROLOL TARTRATE (LOPRESSOR) 25 MG TABLET    Take 25 mg by mouth 2 (two) times daily.   NUTRITIONAL SUPPLEMENTS (RESOURCE 2.0 PO)    Take 90 mLs by mouth. Twice daily   OLANZAPINE (ZYPREXA) 10 MG TABLET    Take 1 tablet (10 mg total) by mouth at bedtime.   PANTOPRAZOLE (PROTONIX) 40 MG TABLET    Take 1 tablet (40 mg total) by mouth 2 (two) times daily.   POLYETHYLENE GLYCOL (MIRALAX / GLYCOLAX) PACKET    Take 17 g by mouth daily.   PRESCRIPTION MEDICATION    Apply 1 mL topically every 6 (six) hours as needed (for agitation). Lorazepam 1mg /ml gel   SENNOSIDES-DOCUSATE SODIUM (SENOKOT-S) 8.6-50 MG TABLET    Take 3 tablets by mouth daily.   Modified Medications   No medications on file  Discontinued Medications   No medications on file     Physical Exam: Physical Exam  Constitutional: She is oriented to person, place, and time. She appears well-developed and well-nourished.  HENT:  Head: Normocephalic and atraumatic.  Eyes: Conjunctivae and EOM are normal. Pupils are equal, round, and reactive to light.  Neck: Normal range of motion. Neck supple. No JVD present. No thyromegaly present.  Cardiovascular: Normal rate and regular rhythm.     Murmur heard.  Systolic murmur is present with a grade of 3/6  3/6  Pulmonary/Chest: Effort normal and breath sounds normal. She has no wheezes. She has no rales.  Abdominal: Soft. Bowel sounds are normal. She exhibits no shifting dullness, no distension and no mass. There is no tenderness. There is no rigidity, no rebound, no guarding, no CVA tenderness and negative Murphy's sign. No hernia.  Multiple external hemorrhoids.   Genitourinary: No vaginal discharge found.  Musculoskeletal: Normal range of motion. She exhibits no edema and no tenderness.  Leaning to her right while in w/c. The right hand grip strength 5/5 but weaker than the left.   Lymphadenopathy:    She has no cervical adenopathy.  Neurological: She is alert and oriented to person, place, and time.  She has normal reflexes. No cranial nerve deficit. She exhibits normal muscle tone. Coordination normal.  Skin: Rash noted. No erythema.  Bumps noted upper chest, upper back, shoulders, thighs.   Psychiatric: Her mood appears anxious (at times). Her affect is angry, labile and inappropriate. Her speech is not delayed and not slurred. She is agitated and aggressive. She is not actively hallucinating. Thought content is delusional. Thought content is not paranoid. Cognition and memory are impaired. She expresses impulsivity and inappropriate judgment. She exhibits abnormal recent memory and abnormal remote memory.    Filed Vitals:   03/08/14 1518  BP: 154/66  Pulse: 80  Temp: 97.6 F (36.4 C)  TempSrc: Tympanic  Resp: 18   Labs reviewed: Basic Metabolic Panel:  Recent Labs  06/14/13 07/22/13 1624 10/15/13 01/07/14  NA 137 140 139 140  K 4.3 4.0 4.3 4.3  CL  --  100  --   --   CO2  --  30  --   --   GLUCOSE  --  155*  --   --   BUN 16 25* 21 25*  CREATININE 1.0 1.07 0.9 1.2*  CALCIUM  --  9.6  --   --   TSH 1.03  --   --  1.38   Liver Function Tests:  Recent Labs  07/22/13 1624 10/15/13 01/07/14  AST 10 13 8*   ALT 10 10 8   ALKPHOS 57 58 54  BILITOT 0.3  --   --   PROT 6.8  --   --   ALBUMIN 3.4*  --   --    CBC:  Recent Labs  07/22/13 1624 10/15/13 01/07/14  WBC 11.6* 8.3 7.0  NEUTROABS 9.8*  --   --   HGB 13.4 14.3 13.5  HCT 41.4 42 40  MCV 93.2  --   --   PLT 283 287 298   Lipid Panel:  Recent Labs  08/16/13  CHOL 218*  HDL 40  LDLCALC 151  TRIG 133    Assessment/Plan Type II or unspecified type diabetes mellitus with renal manifestations, uncontrolled(250.42) Managed with Glimepiride 1mg . Continue to monitor CBG    Unspecified essential hypertension Controlled on Metoprolol 25mg  bid and Enalapril 40mg , off Furosemide 20mg . Bun/creat 25/1.17 01/07/14    Unspecified hypothyroidism Takes Levothyroxine 53mcg,TSH 1.378 01/07/14  Depressive disorder, not elsewhere classified Mood is managed with Amitriptyline  25mg  and Zyprexa 10mg  and Ativan 0.25mg  dialy and prn. Zyprexa was decreased to 7.5mg  02/12/14-will monitor for return of targeted symptoms.     Memory loss [name] in her w/c, takes zyprexa for psychosis-- Zyprexa 10mg  since 03/02/13-reduced to 7.5mg  4/14/15and Ativan 0.25mg  daily after lunch-occasional agitation has been managed with prn Ativan    Unspecified constipation Managed with Amitiza 24mcg bid, MiraLax daily,  and Senokot S III daily    GERD (gastroesophageal reflux disease) Stable on Protonix 40mg  bid.      Atrial fibrillation Rate controlled, continue Metoprolol, off Digoxin, monitor HR  Degeneration of intervertebral disc, site unspecified Tylenol 500mg  tid is adequate for pain.       Family/ Staff Communication: observe the patient.   Goals of Care: SNF  Labs/tests ordered: none.

## 2014-03-08 NOTE — Assessment & Plan Note (Signed)
Tylenol 500mg tid is adequate for pain.   

## 2014-03-08 NOTE — Assessment & Plan Note (Signed)
Managed with Amitiza 24mcg bid, MiraLax daily,  and Senokot S III daily   

## 2014-03-08 NOTE — Assessment & Plan Note (Signed)
Controlled on Metoprolol 25mg bid and Enalapril 40mg, off Furosemide 20mg. Bun/creat 25/1.17 01/07/14  

## 2014-03-08 NOTE — Assessment & Plan Note (Signed)
Takes Levothyroxine 50mcg,TSH 1.378 01/07/14   

## 2014-03-08 NOTE — Assessment & Plan Note (Signed)
Stable on Protonix 40mg bid.   

## 2014-03-19 ENCOUNTER — Encounter: Payer: Self-pay | Admitting: Nurse Practitioner

## 2014-03-19 ENCOUNTER — Non-Acute Institutional Stay (SKILLED_NURSING_FACILITY): Payer: Medicare Other | Admitting: Nurse Practitioner

## 2014-03-19 DIAGNOSIS — IMO0002 Reserved for concepts with insufficient information to code with codable children: Secondary | ICD-10-CM

## 2014-03-19 DIAGNOSIS — K5900 Constipation, unspecified: Secondary | ICD-10-CM

## 2014-03-19 DIAGNOSIS — F3289 Other specified depressive episodes: Secondary | ICD-10-CM

## 2014-03-19 DIAGNOSIS — F03918 Unspecified dementia, unspecified severity, with other behavioral disturbance: Secondary | ICD-10-CM

## 2014-03-19 DIAGNOSIS — I10 Essential (primary) hypertension: Secondary | ICD-10-CM

## 2014-03-19 DIAGNOSIS — F329 Major depressive disorder, single episode, unspecified: Secondary | ICD-10-CM

## 2014-03-19 DIAGNOSIS — I4891 Unspecified atrial fibrillation: Secondary | ICD-10-CM

## 2014-03-19 DIAGNOSIS — F0391 Unspecified dementia with behavioral disturbance: Secondary | ICD-10-CM

## 2014-03-19 DIAGNOSIS — K219 Gastro-esophageal reflux disease without esophagitis: Secondary | ICD-10-CM

## 2014-03-19 DIAGNOSIS — E039 Hypothyroidism, unspecified: Secondary | ICD-10-CM

## 2014-03-19 DIAGNOSIS — E1165 Type 2 diabetes mellitus with hyperglycemia: Secondary | ICD-10-CM

## 2014-03-19 DIAGNOSIS — E1129 Type 2 diabetes mellitus with other diabetic kidney complication: Secondary | ICD-10-CM

## 2014-03-19 NOTE — Assessment & Plan Note (Signed)
Managed with Amitiza 24mcg bid, MiraLax daily,  and Senokot S III daily   

## 2014-03-19 NOTE — Assessment & Plan Note (Signed)
Takes Levothyroxine 50mcg,TSH 1.378 01/07/14   

## 2014-03-19 NOTE — Assessment & Plan Note (Signed)
Stable on Protonix 40mg bid.   

## 2014-03-19 NOTE — Assessment & Plan Note (Signed)
Mood is managed with Amitriptyline  25mg and Zyprexa 7.5mg(02/12/14) and Ativan 0.25mg dialy and prn.      

## 2014-03-19 NOTE — Assessment & Plan Note (Signed)
Controlled on Metoprolol 25mg bid and Enalapril 40mg, off Furosemide 20mg. Bun/creat 25/1.17 01/07/14  

## 2014-03-19 NOTE — Assessment & Plan Note (Signed)
Tylenol 500mg tid is adequate for pain.   

## 2014-03-19 NOTE — Assessment & Plan Note (Signed)
Wanders in her w/c, takes zyprexa for psychosis-- Zyprexa 10mg  since 03/02/13-reduced to 7.5mg  4/14/15and Ativan 0.25mg  daily after lunch-occasional agitation has been managed with prn Ativan. Staff reported the patient has behaviors: cursing, hitting, kicking, combative, angry, refusal of food/meds. Will try Depakote 125mg  bid. Observe.

## 2014-03-19 NOTE — Assessment & Plan Note (Signed)
Managed with Glimepiride 1mg. Continue to monitor CBG   

## 2014-03-19 NOTE — Progress Notes (Signed)
Patient ID: Selena Ward, female   DOB: 1918-08-17, 80 y.o.   MRN: [id number]   Code Status: DNR  Allergies  Allergen Reactions  . Niacin And Related   . Nsaids     Chief Complaint  Patient presents with  . Medical Management of Chronic Issues  . Acute Visit    cursing, hitting, stricking behaviors.     HPI: Patient is a 80 y.o. female seen in the SNF at [facility] today for evaluation of cursing, hitting, kicking, combative, angry, refusal of food/meds and  chronic medical conditions.  Problem List Items Addressed This Visit   Atrial fibrillation     Rate controlled, continue Metoprolol, off Digoxin, monitor HR     Degeneration of intervertebral disc, site unspecified     Tylenol 500mg  tid is adequate for pain.     Dementia with behavioral disturbance - Primary     Wanders in her w/c, takes zyprexa for psychosis-- Zyprexa 10mg  since 03/02/13-reduced to 7.5mg  4/14/15and Ativan 0.25mg  daily after lunch-occasional agitation has been managed with prn Ativan. Staff reported the patient has behaviors: cursing, hitting, kicking, combative, angry, refusal of food/meds. Will try Depakote 125mg  bid. Observe.       Relevant Medications      divalproex (DEPAKOTE) 125 MG DR tablet   Depressive disorder, not elsewhere classified     Mood is managed with Amitriptyline  25mg  and Zyprexa 7.5mg (02/12/14) and Ativan 0.25mg  dialy and prn.        GERD (gastroesophageal reflux disease)     Stable on Protonix 40mg  bid.      Type II or unspecified type diabetes mellitus with renal manifestations, uncontrolled(250.42)     Managed with Glimepiride 1mg . Continue to monitor CBG       Unspecified constipation     Managed with Amitiza 38mcg bid, MiraLax daily,  and Senokot S III daily      Unspecified essential hypertension (Chronic)     Controlled on Metoprolol 25mg  bid and Enalapril 40mg , off Furosemide 20mg . Bun/creat 25/1.17 01/07/14       Unspecified hypothyroidism     Takes  Levothyroxine 29mcg,TSH 1.378 01/07/14        Review of Systems:  Review of Systems  Constitutional: Positive for weight loss. Negative for fever, chills, malaise/fatigue and diaphoresis.  HENT: Positive for hearing loss. Negative for congestion, ear pain and sore throat.   Eyes: Negative for pain, discharge and redness.  Respiratory: Negative for cough, shortness of breath and wheezing.   Cardiovascular: Negative for chest pain, palpitations, orthopnea, claudication, leg swelling and PND.  Gastrointestinal: Negative for heartburn, nausea, vomiting, abdominal pain, diarrhea, constipation and blood in stool.  Genitourinary: Negative for dysuria, urgency, hematuria and flank pain. Frequency: incontinent of bladder.  Musculoskeletal: Positive for falls. Negative for back pain, joint pain, myalgias and neck pain.       Leaning to her right with RUE weaker than the left. Wandering and found sitting on floor in other resident's rooms--lack of safety awareness  Skin: Negative for itching and rash.  Neurological: Negative for dizziness, sensory change, speech change, focal weakness, seizures, loss of consciousness, weakness and headaches.  Endo/Heme/Allergies: Negative for environmental allergies and polydipsia. Does not bruise/bleed easily.  Psychiatric/Behavioral: Positive for memory loss. Negative for depression and hallucinations. The patient is nervous/anxious. The patient does not have insomnia.        Cursing, hitting, kicking, combative, angry, refusal of food/meds      Past Medical History  Diagnosis Date  .  Depression   . Heart murmur   . Hypertension   . Diabetes mellitus without complication   . CHF (congestive heart failure)   . Anxiety   . GERD (gastroesophageal reflux disease)   . Bilateral breast cancer 2004  . Degeneration of intervertebral disc, site unspecified   . Lumbago   . Disorder of bone and cartilage, unspecified   . Debility, unspecified   . Senile dementia  with depressive features   . Unspecified hypothyroidism   . Other and unspecified hyperlipidemia   . Atrial fibrillation   . Unspecified constipation   . Hydronephrosis   . Senile osteoporosis   . Memory loss   . Closed fracture of anatomical neck of humerus   . Hypopotassemia   . Hydronephrosis    Medications: Patient's Medications  New Prescriptions   No medications on file  Previous Medications   ACETAMINOPHEN (TYLENOL) 500 MG TABLET    Take 1,000 mg by mouth 3 (three) times daily.    AMITRIPTYLINE (ELAVIL) 25 MG TABLET    Take 25 mg by mouth at bedtime.   DIVALPROEX (DEPAKOTE) 125 MG DR TABLET    Take 125 mg by mouth 2 (two) times daily.   ENALAPRIL (VASOTEC) 20 MG TABLET    Take 40 mg by mouth daily.   GLIMEPIRIDE (AMARYL) 1 MG TABLET    Take 1 mg by mouth daily with breakfast.   GLUCAGON HCL, RDNA, (GLUCAGEN IJ)    Inject as directed. 1 mg SQ/IV every 20 KITS minutes as needed for CBG <20 and unable to eat and drink   LEVOTHYROXINE (SYNTHROID, LEVOTHROID) 50 MCG TABLET    Take 50 mcg by mouth daily before breakfast.   LORAZEPAM (ATIVAN) 0.5 MG TABLET    Take one tablet by mouth every 6 hours as needed for agitation/anxiety. Take 1/2 tablet every day at 1pm   LUBIPROSTONE (AMITIZA) 24 MCG CAPSULE    Take 24 mcg by mouth 2 (two) times daily with a meal.   METOPROLOL TARTRATE (LOPRESSOR) 25 MG TABLET    Take 25 mg by mouth 2 (two) times daily.   OLANZAPINE (ZYPREXA) 7.5 MG TABLET    Take 7.5 mg by mouth at bedtime.   PANTOPRAZOLE (PROTONIX) 40 MG TABLET    Take 1 tablet (40 mg total) by mouth 2 (two) times daily.   POLYETHYLENE GLYCOL (MIRALAX / GLYCOLAX) PACKET    Take 17 g by mouth daily. Fill cap to 17 GM, mix 4-8 ounces of liquid by mouth at bedtime   PRESCRIPTION MEDICATION    Apply 1 mL topically every 6 (six) hours as needed (for agitation). Lorazepam 1mg /ml gel   SENNOSIDES-DOCUSATE SODIUM (SENOKOT-S) 8.6-50 MG TABLET    Take 3 tablets by mouth daily.   Modified Medications    No medications on file  Discontinued Medications   No medications on file     Physical Exam: Physical Exam  Constitutional: She is oriented to person, place, and time. She appears well-developed and well-nourished.  HENT:  Head: Normocephalic and atraumatic.  Eyes: Conjunctivae and EOM are normal. Pupils are equal, round, and reactive to light.  Neck: Normal range of motion. Neck supple. No JVD present. No thyromegaly present.  Cardiovascular: Normal rate and regular rhythm.   Murmur heard.  Systolic murmur is present with a grade of 3/6  3/6  Pulmonary/Chest: Effort normal and breath sounds normal. She has no wheezes. She has no rales.  Abdominal: Soft. Bowel sounds are normal. She exhibits no shifting dullness,  no distension and no mass. There is no tenderness. There is no rigidity, no rebound, no guarding, no CVA tenderness and negative Murphy's sign. No hernia.  Multiple external hemorrhoids.   Genitourinary: No vaginal discharge found.  Musculoskeletal: Normal range of motion. She exhibits no edema and no tenderness.  Leaning to her right while in w/c. The right hand grip strength 5/5 but weaker than the left.   Lymphadenopathy:    She has no cervical adenopathy.  Neurological: She is alert and oriented to person, place, and time. She has normal reflexes. No cranial nerve deficit. She exhibits normal muscle tone. Coordination normal.  Skin: No rash noted. No erythema.  Psychiatric: Her mood appears anxious (at times). Her affect is angry, labile and inappropriate. Her speech is not delayed and not slurred. She is agitated and aggressive. She is not actively hallucinating. Thought content is delusional. Thought content is not paranoid. Cognition and memory are impaired. She expresses impulsivity and inappropriate judgment. She exhibits abnormal recent memory and abnormal remote memory.    Filed Vitals:   03/19/14 1144  BP: 159/86  Pulse: 67  Temp: 97.4 F (36.3 C)  TempSrc:  Tympanic  Resp: 20   Labs reviewed: Basic Metabolic Panel:  Recent Labs  06/14/13 07/22/13 1624 10/15/13 01/07/14  NA 137 140 139 140  K 4.3 4.0 4.3 4.3  CL  --  100  --   --   CO2  --  30  --   --   GLUCOSE  --  155*  --   --   BUN 16 25* 21 25*  CREATININE 1.0 1.07 0.9 1.2*  CALCIUM  --  9.6  --   --   TSH 1.03  --   --  1.38   Liver Function Tests:  Recent Labs  07/22/13 1624 10/15/13 01/07/14  AST 10 13 8*  ALT 10 10 8   ALKPHOS 57 58 54  BILITOT 0.3  --   --   PROT 6.8  --   --   ALBUMIN 3.4*  --   --    CBC:  Recent Labs  07/22/13 1624 10/15/13 01/07/14  WBC 11.6* 8.3 7.0  NEUTROABS 9.8*  --   --   HGB 13.4 14.3 13.5  HCT 41.4 42 40  MCV 93.2  --   --   PLT 283 287 298   Lipid Panel:  Recent Labs  08/16/13  CHOL 218*  HDL 40  LDLCALC 151  TRIG 133    Assessment/Plan Dementia with behavioral disturbance Wanders in her w/c, takes zyprexa for psychosis-- Zyprexa 10mg  since 03/02/13-reduced to 7.5mg  4/14/15and Ativan 0.25mg  daily after lunch-occasional agitation has been managed with prn Ativan. Staff reported the patient has behaviors: cursing, hitting, kicking, combative, angry, refusal of food/meds. Will try Depakote 125mg  bid. Observe.     Type II or unspecified type diabetes mellitus with renal manifestations, uncontrolled(250.42) Managed with Glimepiride 1mg . Continue to monitor CBG     Unspecified essential hypertension Controlled on Metoprolol 25mg  bid and Enalapril 40mg , off Furosemide 20mg . Bun/creat 25/1.17 01/07/14     Unspecified hypothyroidism Takes Levothyroxine 17mcg,TSH 1.378 01/07/14   Depressive disorder, not elsewhere classified Mood is managed with Amitriptyline  25mg  and Zyprexa 7.5mg (02/12/14) and Ativan 0.25mg  dialy and prn.      Unspecified constipation Managed with Amitiza 12mcg bid, MiraLax daily,  and Senokot S III daily    GERD (gastroesophageal reflux disease) Stable on Protonix 40mg  bid.    Atrial  fibrillation Rate controlled,  continue Metoprolol, off Digoxin, monitor HR   Degeneration of intervertebral disc, site unspecified Tylenol 500mg  tid is adequate for pain.     Family/ Staff Communication: observe the patient.   Goals of Care: SNF  Labs/tests ordered: none.

## 2014-03-19 NOTE — Assessment & Plan Note (Signed)
Rate controlled, continue Metoprolol, off Digoxin, monitor HR  

## 2014-04-12 ENCOUNTER — Encounter: Payer: Self-pay | Admitting: Nurse Practitioner

## 2014-04-12 ENCOUNTER — Non-Acute Institutional Stay (SKILLED_NURSING_FACILITY): Payer: Medicare Other | Admitting: Nurse Practitioner

## 2014-04-12 DIAGNOSIS — F03918 Unspecified dementia, unspecified severity, with other behavioral disturbance: Secondary | ICD-10-CM

## 2014-04-12 DIAGNOSIS — E1165 Type 2 diabetes mellitus with hyperglycemia: Principal | ICD-10-CM

## 2014-04-12 DIAGNOSIS — F0391 Unspecified dementia with behavioral disturbance: Secondary | ICD-10-CM

## 2014-04-12 DIAGNOSIS — I4891 Unspecified atrial fibrillation: Secondary | ICD-10-CM

## 2014-04-12 DIAGNOSIS — K5900 Constipation, unspecified: Secondary | ICD-10-CM

## 2014-04-12 DIAGNOSIS — I10 Essential (primary) hypertension: Secondary | ICD-10-CM

## 2014-04-12 DIAGNOSIS — F329 Major depressive disorder, single episode, unspecified: Secondary | ICD-10-CM

## 2014-04-12 DIAGNOSIS — E1129 Type 2 diabetes mellitus with other diabetic kidney complication: Secondary | ICD-10-CM

## 2014-04-12 DIAGNOSIS — K219 Gastro-esophageal reflux disease without esophagitis: Secondary | ICD-10-CM

## 2014-04-12 DIAGNOSIS — E039 Hypothyroidism, unspecified: Secondary | ICD-10-CM

## 2014-04-12 DIAGNOSIS — F3289 Other specified depressive episodes: Secondary | ICD-10-CM

## 2014-04-12 NOTE — Assessment & Plan Note (Signed)
Managed with Amitiza 45mcg bid, MiraLax daily,  and Senokot S III daily

## 2014-04-12 NOTE — Assessment & Plan Note (Signed)
Stable on Protonix 40mg  bid.

## 2014-04-12 NOTE — Assessment & Plan Note (Signed)
Mood is managed with Amitriptyline  25mg  and Zyprexa 7.5mg (02/12/14) and Ativan 0.25mg  dialy and prn.

## 2014-04-12 NOTE — Assessment & Plan Note (Signed)
Takes Levothyroxine 59mcg,TSH 1.378 01/07/14

## 2014-04-12 NOTE — Assessment & Plan Note (Signed)
Rate controlled, continue Metoprolol, off Digoxin, monitor HR

## 2014-04-12 NOTE — Progress Notes (Signed)
Patient ID: Selena Ward, female   DOB: May 13, 1918, 80 y.o.   MRN: [id number]   Code Status: DNR  Allergies  Allergen Reactions  . Niacin And Related   . Nsaids     Chief Complaint  Patient presents with  . Medical Management of Chronic Issues    HPI: Patient is a 80 y.o. female seen in the SNF at [facility] today for evaluation of chronic medical conditions.  Problem List Items Addressed This Visit   Unspecified hypothyroidism     Takes Levothyroxine 18mcg,TSH 1.378 01/07/14      Unspecified essential hypertension (Chronic)     Controlled on Metoprolol 25mg  bid and Enalapril 40mg , off Furosemide 20mg . Bun/creat 25/1.17 01/07/14      Unspecified constipation     Managed with Amitiza 65mcg bid, MiraLax daily,  and Senokot S III daily     Type II or unspecified type diabetes mellitus with renal manifestations, uncontrolled(250.42) - Primary     Managed with Glimepiride 1mg . Continue to monitor CBG. Update Hgb A1c     GERD (gastroesophageal reflux disease)     Stable on Protonix 40mg  bid.      Depressive disorder, not elsewhere classified     Mood is managed with Amitriptyline  25mg  and Zyprexa 7.5mg (02/12/14) and Ativan 0.25mg  dialy and prn.         Dementia with behavioral disturbance     Wanders in her w/c, takes zyprexa for psychosis-- Zyprexa 10mg  since 03/02/13-reduced to 7.5mg  4/14/15and Ativan 0.25mg  daily after lunch-occasional agitation has been managed with prn Ativan. Staff reported the patient has behaviors: cursing, hitting, kicking, combative, angry, refusal of food/meds. Better since  Depakote 125mg  bid 04/19/14. Observe.        Atrial fibrillation     Rate controlled, continue Metoprolol, off Digoxin, monitor HR        Review of Systems:  Review of Systems  Constitutional: Positive for weight loss. Negative for fever, chills, malaise/fatigue and diaphoresis.  HENT: Positive for hearing loss. Negative for congestion, ear pain and sore throat.    Eyes: Negative for pain, discharge and redness.  Respiratory: Negative for cough, shortness of breath and wheezing.   Cardiovascular: Negative for chest pain, palpitations, orthopnea, claudication, leg swelling and PND.  Gastrointestinal: Negative for heartburn, nausea, vomiting, abdominal pain, diarrhea, constipation and blood in stool.  Genitourinary: Negative for dysuria, urgency, hematuria and flank pain. Frequency: incontinent of bladder.  Musculoskeletal: Positive for falls. Negative for back pain, joint pain, myalgias and neck pain.       Leaning to her right with RUE weaker than the left. Wandering and found sitting on floor in other resident's rooms--lack of safety awareness  Skin: Negative for itching and rash.  Neurological: Negative for dizziness, sensory change, speech change, focal weakness, seizures, loss of consciousness, weakness and headaches.  Endo/Heme/Allergies: Negative for environmental allergies and polydipsia. Does not bruise/bleed easily.  Psychiatric/Behavioral: Positive for memory loss. Negative for depression and hallucinations. The patient is nervous/anxious. The patient does not have insomnia.        Cursing, hitting, kicking, combative, angry, refusal of food/meds      Past Medical History  Diagnosis Date  . Depression   . Heart murmur   . Hypertension   . Diabetes mellitus without complication   . CHF (congestive heart failure)   . Anxiety   . GERD (gastroesophageal reflux disease)   . Bilateral breast cancer 2004  . Degeneration of intervertebral disc, site unspecified   .  Lumbago   . Disorder of bone and cartilage, unspecified   . Debility, unspecified   . Senile dementia with depressive features   . Unspecified hypothyroidism   . Other and unspecified hyperlipidemia   . Atrial fibrillation   . Unspecified constipation   . Hydronephrosis   . Senile osteoporosis   . Memory loss   . Closed fracture of anatomical neck of humerus   .  Hypopotassemia   . Hydronephrosis    Medications: Patient's Medications  New Prescriptions   No medications on file  Previous Medications   ACETAMINOPHEN (TYLENOL) 500 MG TABLET    Take 1,000 mg by mouth 3 (three) times daily.    AMITRIPTYLINE (ELAVIL) 25 MG TABLET    Take 25 mg by mouth at bedtime.   DIVALPROEX (DEPAKOTE) 125 MG DR TABLET    Take 125 mg by mouth 2 (two) times daily.   ENALAPRIL (VASOTEC) 20 MG TABLET    Take 40 mg by mouth daily.   GLIMEPIRIDE (AMARYL) 1 MG TABLET    Take 1 mg by mouth daily with breakfast.   GLUCAGON HCL, RDNA, (GLUCAGEN IJ)    Inject as directed. 1 mg SQ/IV every 20 KITS minutes as needed for CBG <20 and unable to eat and drink   LEVOTHYROXINE (SYNTHROID, LEVOTHROID) 50 MCG TABLET    Take 50 mcg by mouth daily before breakfast.   LORAZEPAM (ATIVAN) 0.5 MG TABLET    Take one tablet by mouth every 6 hours as needed for agitation/anxiety. Take 1/2 tablet every day at 1pm   LUBIPROSTONE (AMITIZA) 24 MCG CAPSULE    Take 24 mcg by mouth 2 (two) times daily with a meal.   METOPROLOL TARTRATE (LOPRESSOR) 25 MG TABLET    Take 25 mg by mouth 2 (two) times daily.   OLANZAPINE (ZYPREXA) 7.5 MG TABLET    Take 7.5 mg by mouth at bedtime.   PANTOPRAZOLE (PROTONIX) 40 MG TABLET    Take 1 tablet (40 mg total) by mouth 2 (two) times daily.   POLYETHYLENE GLYCOL (MIRALAX / GLYCOLAX) PACKET    Take 17 g by mouth daily. Fill cap to 17 GM, mix 4-8 ounces of liquid by mouth at bedtime   PRESCRIPTION MEDICATION    Apply 1 mL topically every 6 (six) hours as needed (for agitation). Lorazepam 1mg /ml gel   SENNOSIDES-DOCUSATE SODIUM (SENOKOT-S) 8.6-50 MG TABLET    Take 3 tablets by mouth daily.   Modified Medications   No medications on file  Discontinued Medications   No medications on file     Physical Exam: Physical Exam  Constitutional: She is oriented to person, place, and time. She appears well-developed and well-nourished.  HENT:  Head: Normocephalic and atraumatic.   Eyes: Conjunctivae and EOM are normal. Pupils are equal, round, and reactive to light.  Neck: Normal range of motion. Neck supple. No JVD present. No thyromegaly present.  Cardiovascular: Normal rate and regular rhythm.   Murmur heard.  Systolic murmur is present with a grade of 3/6  3/6  Pulmonary/Chest: Effort normal and breath sounds normal. She has no wheezes. She has no rales.  Abdominal: Soft. Bowel sounds are normal. She exhibits no shifting dullness, no distension and no mass. There is no tenderness. There is no rigidity, no rebound, no guarding, no CVA tenderness and negative Murphy's sign. No hernia.  Multiple external hemorrhoids.   Genitourinary: No vaginal discharge found.  Musculoskeletal: Normal range of motion. She exhibits no edema and no tenderness.  Leaning to  her right while in w/c. The right hand grip strength 5/5 but weaker than the left.   Lymphadenopathy:    She has no cervical adenopathy.  Neurological: She is alert and oriented to person, place, and time. She has normal reflexes. No cranial nerve deficit. She exhibits normal muscle tone. Coordination normal.  Skin: No rash noted. No erythema.  R+L mastectomy scars  Psychiatric: Her mood appears anxious (at times). Her affect is angry, labile and inappropriate. Her speech is not delayed and not slurred. She is agitated and aggressive. She is not actively hallucinating. Thought content is delusional. Thought content is not paranoid. Cognition and memory are impaired. She expresses impulsivity and inappropriate judgment. She exhibits abnormal recent memory and abnormal remote memory.    Filed Vitals:   04/12/14 1204  BP: 126/70  Pulse: 64  Temp: 96.2 F (35.7 C)  TempSrc: Tympanic  Resp: 16   Labs reviewed: Basic Metabolic Panel:  Recent Labs  06/14/13 07/22/13 1624 10/15/13 01/07/14  NA 137 140 139 140  K 4.3 4.0 4.3 4.3  CL  --  100  --   --   CO2  --  30  --   --   GLUCOSE  --  155*  --   --   BUN  16 25* 21 25*  CREATININE 1.0 1.07 0.9 1.2*  CALCIUM  --  9.6  --   --   TSH 1.03  --   --  1.38   Liver Function Tests:  Recent Labs  07/22/13 1624 10/15/13 01/07/14  AST 10 13 8*  ALT 10 10 8   ALKPHOS 57 58 54  BILITOT 0.3  --   --   PROT 6.8  --   --   ALBUMIN 3.4*  --   --    CBC:  Recent Labs  07/22/13 1624 10/15/13 01/07/14  WBC 11.6* 8.3 7.0  NEUTROABS 9.8*  --   --   HGB 13.4 14.3 13.5  HCT 41.4 42 40  MCV 93.2  --   --   PLT 283 287 298   Lipid Panel:  Recent Labs  08/16/13  CHOL 218*  HDL 40  LDLCALC 151  TRIG 133    Assessment/Plan Type II or unspecified type diabetes mellitus with renal manifestations, uncontrolled(250.42) Managed with Glimepiride 1mg . Continue to monitor CBG. Update Hgb A1c   Unspecified essential hypertension Controlled on Metoprolol 25mg  bid and Enalapril 40mg , off Furosemide 20mg . Bun/creat 25/1.17 01/07/14    Unspecified hypothyroidism Takes Levothyroxine 46mcg,TSH 1.378 01/07/14    Dementia with behavioral disturbance Wanders in her w/c, takes zyprexa for psychosis-- Zyprexa 10mg  since 03/02/13-reduced to 7.5mg  4/14/15and Ativan 0.25mg  daily after lunch-occasional agitation has been managed with prn Ativan. Staff reported the patient has behaviors: cursing, hitting, kicking, combative, angry, refusal of food/meds. Better since  Depakote 125mg  bid 04/19/14. Observe.      Depressive disorder, not elsewhere classified Mood is managed with Amitriptyline  25mg  and Zyprexa 7.5mg (02/12/14) and Ativan 0.25mg  dialy and prn.       GERD (gastroesophageal reflux disease) Stable on Protonix 40mg  bid.    Unspecified constipation Managed with Amitiza 28mcg bid, MiraLax daily,  and Senokot S III daily   Atrial fibrillation Rate controlled, continue Metoprolol, off Digoxin, monitor HR     Family/ Staff Communication: observe the patient.   Goals of Care: SNF  Labs/tests ordered: none.

## 2014-04-12 NOTE — Assessment & Plan Note (Signed)
Managed with Glimepiride 1mg . Continue to monitor CBG. Update Hgb A1c

## 2014-04-12 NOTE — Assessment & Plan Note (Signed)
Wanders in her w/c, takes zyprexa for psychosis-- Zyprexa 10mg  since 03/02/13-reduced to 7.5mg  4/14/15and Ativan 0.25mg  daily after lunch-occasional agitation has been managed with prn Ativan. Staff reported the patient has behaviors: cursing, hitting, kicking, combative, angry, refusal of food/meds. Better since  Depakote 125mg  bid 04/19/14. Observe.

## 2014-04-12 NOTE — Assessment & Plan Note (Signed)
Controlled on Metoprolol 25mg  bid and Enalapril 40mg , off Furosemide 20mg . Bun/creat 25/1.17 01/07/14

## 2014-04-15 LAB — HEMOGLOBIN A1C: HEMOGLOBIN A1C: 5.9 % (ref 4.0–6.0)

## 2014-04-16 ENCOUNTER — Other Ambulatory Visit: Payer: Self-pay | Admitting: Nurse Practitioner

## 2014-04-16 DIAGNOSIS — E1165 Type 2 diabetes mellitus with hyperglycemia: Principal | ICD-10-CM

## 2014-04-16 DIAGNOSIS — E1129 Type 2 diabetes mellitus with other diabetic kidney complication: Secondary | ICD-10-CM

## 2014-04-22 ENCOUNTER — Other Ambulatory Visit: Payer: Self-pay | Admitting: *Deleted

## 2014-04-22 MED ORDER — AMBULATORY NON FORMULARY MEDICATION: Status: AC

## 2014-04-22 NOTE — Telephone Encounter (Signed)
[facility] 

## 2014-04-23 ENCOUNTER — Non-Acute Institutional Stay (SKILLED_NURSING_FACILITY): Payer: Medicare Other | Admitting: Nurse Practitioner

## 2014-04-23 ENCOUNTER — Encounter: Payer: Self-pay | Admitting: Nurse Practitioner

## 2014-04-23 DIAGNOSIS — F329 Major depressive disorder, single episode, unspecified: Secondary | ICD-10-CM

## 2014-04-23 DIAGNOSIS — I4891 Unspecified atrial fibrillation: Secondary | ICD-10-CM

## 2014-04-23 DIAGNOSIS — I482 Chronic atrial fibrillation, unspecified: Secondary | ICD-10-CM

## 2014-04-23 DIAGNOSIS — E039 Hypothyroidism, unspecified: Secondary | ICD-10-CM

## 2014-04-23 DIAGNOSIS — E1165 Type 2 diabetes mellitus with hyperglycemia: Secondary | ICD-10-CM

## 2014-04-23 DIAGNOSIS — I10 Essential (primary) hypertension: Secondary | ICD-10-CM

## 2014-04-23 DIAGNOSIS — L89621 Pressure ulcer of left heel, stage 1: Secondary | ICD-10-CM

## 2014-04-23 DIAGNOSIS — K219 Gastro-esophageal reflux disease without esophagitis: Secondary | ICD-10-CM

## 2014-04-23 DIAGNOSIS — F3289 Other specified depressive episodes: Secondary | ICD-10-CM

## 2014-04-23 DIAGNOSIS — E1129 Type 2 diabetes mellitus with other diabetic kidney complication: Secondary | ICD-10-CM

## 2014-04-23 DIAGNOSIS — L89609 Pressure ulcer of unspecified heel, unspecified stage: Secondary | ICD-10-CM

## 2014-04-23 DIAGNOSIS — K5900 Constipation, unspecified: Secondary | ICD-10-CM

## 2014-04-23 DIAGNOSIS — R413 Other amnesia: Secondary | ICD-10-CM

## 2014-04-23 DIAGNOSIS — L8991 Pressure ulcer of unspecified site, stage 1: Secondary | ICD-10-CM

## 2014-04-23 DIAGNOSIS — IMO0002 Reserved for concepts with insufficient information to code with codable children: Secondary | ICD-10-CM

## 2014-04-23 NOTE — Assessment & Plan Note (Signed)
Mood is managed with Amitriptyline  25mg  and Zyprexa 5mg  and Ativan 0.25mg  dialy and prn.

## 2014-04-23 NOTE — Assessment & Plan Note (Signed)
Controlled on Metoprolol 25mg  bid and Enalapril 40mg , off Furosemide 20mg . Bun/creat 25/1.17 01/07/14

## 2014-04-23 NOTE — Assessment & Plan Note (Signed)
Stable on Protonix 40mg  bid.

## 2014-04-23 NOTE — Assessment & Plan Note (Signed)
Rate controlled, continue Metoprolol, off Digoxin, monitor HR

## 2014-04-23 NOTE — Assessment & Plan Note (Signed)
Tylenol 500mg  tid is adequate for pain.

## 2014-04-23 NOTE — Assessment & Plan Note (Signed)
Left heel redness un blanchable, pressure reduction, apply skin prep to the area for protection.

## 2014-04-23 NOTE — Assessment & Plan Note (Signed)
takes Levothyroxine 50mcg,TSH 1.378 01/07/14

## 2014-04-23 NOTE — Assessment & Plan Note (Signed)
Wanders in her w/c, takes zyprexa for psychosis-- Zyprexa 10mg  since 03/02/13-reduced to 7.5mg  4/14/15and Ativan 0.25mg  daily after lunch-occasional agitation has been managed with prn Ativan. Depakote 125mg  bid since 04/19/13 for pm agitation/restlessness. Now staff reported the patient becoming increasingly lethargic in the evening, sleeps through dinner and misses all evening medications. Will reduce Zyprexa to 5mg  and observe the patient.

## 2014-04-23 NOTE — Progress Notes (Signed)
Patient ID: Selena Ward, female   DOB: 12/15/1917, 80 y.o.   MRN: [id number]   Code Status: DNR  Allergies  Allergen Reactions  . Niacin And Related   . Nsaids     Chief Complaint  Patient presents with  . Medical Management of Chronic Issues  . Acute Visit    pm sleepiness, left heel redness.     HPI: Patient is a 80 y.o. female seen in the SNF at [facility] today for evaluation of pm sleepiness, left heel redness, and chronic medical conditions.  Problem List Items Addressed This Visit   Unspecified hypothyroidism     takes Levothyroxine 70mcg,TSH 1.378 01/07/14     Unspecified essential hypertension (Chronic)     Controlled on Metoprolol 25mg  bid and Enalapril 40mg , off Furosemide 20mg . Bun/creat 25/1.17 01/07/14       Unspecified constipation     Managed with Amitiza 69mcg bid, MiraLax daily,  and Senokot S III daily      Type II or unspecified type diabetes mellitus with renal manifestations, uncontrolled - Primary     Off Glimepiride 1mg  due to hypoglycemic episodes. Hgb A1c 5.9 04/15/14. Continue to monitor CBG.    Stage I pressure ulcer of left heel     Left heel redness un blanchable, pressure reduction, apply skin prep to the area for protection.     Memory loss     [name] in her w/c, takes zyprexa for psychosis-- Zyprexa 10mg  since 03/02/13-reduced to 7.5mg  4/14/15and Ativan 0.25mg  daily after lunch-occasional agitation has been managed with prn Ativan. Depakote 125mg  bid since 04/19/13 for pm agitation/restlessness. Now staff reported the patient becoming increasingly lethargic in the evening, sleeps through dinner and misses all evening medications. Will reduce Zyprexa to 5mg  and observe the patient.     GERD (gastroesophageal reflux disease)     Stable on Protonix 40mg  bid.       Depressive disorder, not elsewhere classified     Mood is managed with Amitriptyline  25mg  and Zyprexa 5mg  and Ativan 0.25mg  dialy and prn.         Degeneration of  intervertebral disc, site unspecified     Tylenol 500mg  tid is adequate for pain.      Atrial fibrillation     Rate controlled, continue Metoprolol, off Digoxin, monitor HR         Review of Systems:  Review of Systems  Constitutional: Positive for weight loss. Negative for fever, chills, malaise/fatigue and diaphoresis.  HENT: Positive for hearing loss. Negative for congestion, ear pain and sore throat.   Eyes: Negative for pain, discharge and redness.  Respiratory: Negative for cough, shortness of breath and wheezing.   Cardiovascular: Negative for chest pain, palpitations, orthopnea, claudication, leg swelling and PND.  Gastrointestinal: Negative for heartburn, nausea, vomiting, abdominal pain, diarrhea, constipation and blood in stool.  Genitourinary: Negative for dysuria, urgency, hematuria and flank pain. Frequency: incontinent of bladder.  Musculoskeletal: Positive for falls. Negative for back pain, joint pain, myalgias and neck pain.       Leaning to her right with RUE weaker than the left. Wandering and found sitting on floor in other resident's rooms--lack of safety awareness  Skin: Negative for itching and rash.       Left heel redness.   Neurological: Negative for dizziness, sensory change, speech change, focal weakness, seizures, loss of consciousness, weakness and headaches.  Endo/Heme/Allergies: Negative for environmental allergies and polydipsia. Does not bruise/bleed easily.  Psychiatric/Behavioral: Positive for memory loss.  Negative for depression and hallucinations. The patient is nervous/anxious. The patient does not have insomnia.        Cursing, hitting, kicking, combative, angry, refusal of food/meds      Past Medical History  Diagnosis Date  . Depression   . Heart murmur   . Hypertension   . Diabetes mellitus without complication   . CHF (congestive heart failure)   . Anxiety   . GERD (gastroesophageal reflux disease)   . Bilateral breast cancer 2004    . Degeneration of intervertebral disc, site unspecified   . Lumbago   . Disorder of bone and cartilage, unspecified   . Debility, unspecified   . Senile dementia with depressive features   . Unspecified hypothyroidism   . Other and unspecified hyperlipidemia   . Atrial fibrillation   . Unspecified constipation   . Hydronephrosis   . Senile osteoporosis   . Memory loss   . Closed fracture of anatomical neck of humerus   . Hypopotassemia   . Hydronephrosis    Medications: Patient's Medications  New Prescriptions   No medications on file  Previous Medications   ACETAMINOPHEN (TYLENOL) 500 MG TABLET    Take 1,000 mg by mouth 3 (three) times daily.    AMBULATORY NON FORMULARY MEDICATION    Lorazepam 1mg /ml Gel Sig: Apply 25ml topically every 6 hours as needed for agitation   AMITRIPTYLINE (ELAVIL) 25 MG TABLET    Take 25 mg by mouth at bedtime.   DIVALPROEX (DEPAKOTE) 125 MG DR TABLET    Take 125 mg by mouth 2 (two) times daily.   ENALAPRIL (VASOTEC) 20 MG TABLET    Take 40 mg by mouth daily.   GLIMEPIRIDE (AMARYL) 1 MG TABLET    Take 1 mg by mouth daily with breakfast.   GLUCAGON HCL, RDNA, (GLUCAGEN IJ)    Inject as directed. 1 mg SQ/IV every 20 KITS minutes as needed for CBG <20 and unable to eat and drink   LEVOTHYROXINE (SYNTHROID, LEVOTHROID) 50 MCG TABLET    Take 50 mcg by mouth daily before breakfast.   LORAZEPAM (ATIVAN) 0.5 MG TABLET    Take one tablet by mouth every 6 hours as needed for agitation/anxiety. Take 1/2 tablet every day at 1pm   LUBIPROSTONE (AMITIZA) 24 MCG CAPSULE    Take 24 mcg by mouth 2 (two) times daily with a meal.   METOPROLOL TARTRATE (LOPRESSOR) 25 MG TABLET    Take 25 mg by mouth 2 (two) times daily.   OLANZAPINE (ZYPREXA) 7.5 MG TABLET    Take 7.5 mg by mouth at bedtime.   PANTOPRAZOLE (PROTONIX) 40 MG TABLET    Take 1 tablet (40 mg total) by mouth 2 (two) times daily.   POLYETHYLENE GLYCOL (MIRALAX / GLYCOLAX) PACKET    Take 17 g by mouth daily. Fill  cap to 17 GM, mix 4-8 ounces of liquid by mouth at bedtime   PRESCRIPTION MEDICATION    Apply 1 mL topically every 6 (six) hours as needed (for agitation). Lorazepam 1mg /ml gel   SENNOSIDES-DOCUSATE SODIUM (SENOKOT-S) 8.6-50 MG TABLET    Take 3 tablets by mouth daily.   Modified Medications   No medications on file  Discontinued Medications   No medications on file     Physical Exam: Physical Exam  Constitutional: She is oriented to person, place, and time. She appears well-developed and well-nourished.  HENT:  Head: Normocephalic and atraumatic.  Eyes: Conjunctivae and EOM are normal. Pupils are equal, round, and reactive  to light.  Neck: Normal range of motion. Neck supple. No JVD present. No thyromegaly present.  Cardiovascular: Normal rate and regular rhythm.   Murmur heard.  Systolic murmur is present with a grade of 3/6  3/6  Pulmonary/Chest: Effort normal and breath sounds normal. She has no wheezes. She has no rales.  Abdominal: Soft. Bowel sounds are normal. She exhibits no shifting dullness, no distension and no mass. There is no tenderness. There is no rigidity, no rebound, no guarding, no CVA tenderness and negative Murphy's sign. No hernia.  Multiple external hemorrhoids.   Genitourinary: No vaginal discharge found.  Musculoskeletal: Normal range of motion. She exhibits no edema and no tenderness.  Leaning to her right while in w/c. The right hand grip strength 5/5 but weaker than the left.   Lymphadenopathy:    She has no cervical adenopathy.  Neurological: She is alert and oriented to person, place, and time. She has normal reflexes. No cranial nerve deficit. She exhibits normal muscle tone. Coordination normal.  Skin: No rash noted. No erythema.  R+L mastectomy scars. Left heel un blanchable reddened area from pressure   Psychiatric: Her mood appears anxious (at times). Her affect is angry, labile and inappropriate. Her speech is not delayed and not slurred. She is  agitated and aggressive. She is not actively hallucinating. Thought content is delusional. Thought content is not paranoid. Cognition and memory are impaired. She expresses impulsivity and inappropriate judgment. She exhibits abnormal recent memory and abnormal remote memory.    Filed Vitals:   04/23/14 1257  BP: 150/74  Pulse: 85  Temp: 96.7 F (35.9 C)  TempSrc: Tympanic  Resp: 16   Labs reviewed: Basic Metabolic Panel:  Recent Labs  06/14/13 07/22/13 1624 10/15/13 01/07/14  NA 137 140 139 140  K 4.3 4.0 4.3 4.3  CL  --  100  --   --   CO2  --  30  --   --   GLUCOSE  --  155*  --   --   BUN 16 25* 21 25*  CREATININE 1.0 1.07 0.9 1.2*  CALCIUM  --  9.6  --   --   TSH 1.03  --   --  1.38   Liver Function Tests:  Recent Labs  07/22/13 1624 10/15/13 01/07/14  AST 10 13 8*  ALT 10 10 8   ALKPHOS 57 58 54  BILITOT 0.3  --   --   PROT 6.8  --   --   ALBUMIN 3.4*  --   --    CBC:  Recent Labs  07/22/13 1624 10/15/13 01/07/14  WBC 11.6* 8.3 7.0  NEUTROABS 9.8*  --   --   HGB 13.4 14.3 13.5  HCT 41.4 42 40  MCV 93.2  --   --   PLT 283 287 298   Lipid Panel:  Recent Labs  08/16/13  CHOL 218*  HDL 40  LDLCALC 151  TRIG 133    Assessment/Plan Type II or unspecified type diabetes mellitus with renal manifestations, uncontrolled Off Glimepiride 1mg  due to hypoglycemic episodes. Hgb A1c 5.9 04/15/14. Continue to monitor CBG.  Unspecified essential hypertension Controlled on Metoprolol 25mg  bid and Enalapril 40mg , off Furosemide 20mg . Bun/creat 25/1.17 01/07/14     Unspecified hypothyroidism takes Levothyroxine 44mcg,TSH 1.378 01/07/14   Unspecified constipation Managed with Amitiza 45mcg bid, MiraLax daily,  and Senokot S III daily    Memory loss Wanders in her w/c, takes zyprexa for psychosis-- Zyprexa 10mg  since 03/02/13-reduced  to 7.5mg  4/14/15and Ativan 0.25mg  daily after lunch-occasional agitation has been managed with prn Ativan. Depakote 125mg  bid  since 04/19/13 for pm agitation/restlessness. Now staff reported the patient becoming increasingly lethargic in the evening, sleeps through dinner and misses all evening medications. Will reduce Zyprexa to 5mg  and observe the patient.   GERD (gastroesophageal reflux disease) Stable on Protonix 40mg  bid.     Depressive disorder, not elsewhere classified Mood is managed with Amitriptyline  25mg  and Zyprexa 5mg  and Ativan 0.25mg  dialy and prn.       Degeneration of intervertebral disc, site unspecified Tylenol 500mg  tid is adequate for pain.    Atrial fibrillation Rate controlled, continue Metoprolol, off Digoxin, monitor HR    Stage I pressure ulcer of left heel Left heel redness un blanchable, pressure reduction, apply skin prep to the area for protection.     Family/ Staff Communication: observe the patient.   Goals of Care: SNF  Labs/tests ordered: none.

## 2014-04-23 NOTE — Assessment & Plan Note (Signed)
Off Glimepiride 1mg  due to hypoglycemic episodes. Hgb A1c 5.9 04/15/14. Continue to monitor CBG.

## 2014-04-23 NOTE — Assessment & Plan Note (Signed)
Managed with Amitiza 77mcg bid, MiraLax daily,  and Senokot S III daily

## 2014-04-26 ENCOUNTER — Other Ambulatory Visit: Payer: Self-pay | Admitting: Nurse Practitioner

## 2014-04-26 DIAGNOSIS — L89621 Pressure ulcer of left heel, stage 1: Secondary | ICD-10-CM

## 2014-04-26 DIAGNOSIS — F03918 Unspecified dementia, unspecified severity, with other behavioral disturbance: Secondary | ICD-10-CM

## 2014-04-26 DIAGNOSIS — F0391 Unspecified dementia with behavioral disturbance: Secondary | ICD-10-CM

## 2014-04-26 MED ORDER — LORAZEPAM 0.5 MG PO TABS: 0.5000 mg | ORAL_TABLET | Freq: Four times a day (QID) | ORAL | Status: DC | PRN

## 2014-05-07 ENCOUNTER — Encounter: Payer: Self-pay | Admitting: Nurse Practitioner

## 2014-05-07 ENCOUNTER — Non-Acute Institutional Stay (SKILLED_NURSING_FACILITY): Payer: Medicare Other | Admitting: Nurse Practitioner

## 2014-05-07 DIAGNOSIS — K5900 Constipation, unspecified: Secondary | ICD-10-CM

## 2014-05-07 DIAGNOSIS — E039 Hypothyroidism, unspecified: Secondary | ICD-10-CM

## 2014-05-07 DIAGNOSIS — L89109 Pressure ulcer of unspecified part of back, unspecified stage: Secondary | ICD-10-CM

## 2014-05-07 DIAGNOSIS — L89159 Pressure ulcer of sacral region, unspecified stage: Secondary | ICD-10-CM | POA: Insufficient documentation

## 2014-05-07 DIAGNOSIS — L8991 Pressure ulcer of unspecified site, stage 1: Secondary | ICD-10-CM

## 2014-05-07 DIAGNOSIS — R413 Other amnesia: Secondary | ICD-10-CM

## 2014-05-07 DIAGNOSIS — I10 Essential (primary) hypertension: Secondary | ICD-10-CM

## 2014-05-07 DIAGNOSIS — F329 Major depressive disorder, single episode, unspecified: Secondary | ICD-10-CM

## 2014-05-07 DIAGNOSIS — F3289 Other specified depressive episodes: Secondary | ICD-10-CM

## 2014-05-07 DIAGNOSIS — E1165 Type 2 diabetes mellitus with hyperglycemia: Secondary | ICD-10-CM

## 2014-05-07 DIAGNOSIS — R4189 Other symptoms and signs involving cognitive functions and awareness: Secondary | ICD-10-CM

## 2014-05-07 DIAGNOSIS — E1129 Type 2 diabetes mellitus with other diabetic kidney complication: Secondary | ICD-10-CM

## 2014-05-07 DIAGNOSIS — R404 Transient alteration of awareness: Secondary | ICD-10-CM | POA: Insufficient documentation

## 2014-05-07 DIAGNOSIS — L89151 Pressure ulcer of sacral region, stage 1: Secondary | ICD-10-CM

## 2014-05-07 NOTE — Assessment & Plan Note (Addendum)
05/07/14 down Zyprexa to 5mg  qod until current supplies exhausted. Continue Amitriptyline 25mg  daily.

## 2014-05-07 NOTE — Progress Notes (Signed)
Patient ID: Selena Ward, female   DOB: 1918/04/03, 80 y.o.   MRN: [id number]   Code Status: DNR  Allergies  Allergen Reactions  . Niacin And Related   . Nsaids     Chief Complaint  Patient presents with  . Medical Management of Chronic Issues  . Acute Visit    refusal of meds, episodes of unresponsiveness.     HPI: Patient is a 80 y.o. female seen in the SNF at [facility] today for evaluation of an episode of unresponsiveness, reddened coccyx,  and chronic medical conditions.  Problem List Items Addressed This Visit   Unspecified essential hypertension - Primary (Chronic)     Controlled on Metoprolol 25mg  bid and Enalapril 40mg , off Furosemide 20mg . Bun/creat 25/1.17 01/07/14       Unspecified hypothyroidism     takes Levothyroxine 39mcg,TSH 1.378 01/07/14     Type II or unspecified type diabetes mellitus with renal manifestations, uncontrolled     Off Glimepiride 1mg  due to hypoglycemic episodes. Hgb A1c 5.9 04/15/14. Continue to monitor CBG.     Depressive disorder, not elsewhere classified     05/07/14 down Zyprexa to 5mg  qod until current supplies exhausted. Continue Amitriptyline 25mg  daily.      Unspecified constipation     Managed with Amitiza 74mcg bid, MiraLax daily,  and Senokot S III daily       Memory loss     Better with Depakote 125mg  bid since 04/19/13 for pm agitation/restlessness. Taper off Zyprexa due to her episode of unresponsiveness.     Unresponsive episode     05/06/14 an episode of unresponsiveness, no change in focal neurological symptoms,  trial of medication reduction-Taper off Zyprexa due to her episode of unresponsiveness. Continue to monitor the patient.     Decubitus ulcer of coccyx     Reddened un blanchable area-pressure reduction and heavy barrier cream bid.        Review of Systems:  Review of Systems  Constitutional: Positive for weight loss. Negative for fever, chills, malaise/fatigue and diaphoresis.  HENT: Positive for  hearing loss. Negative for congestion, ear pain and sore throat.   Eyes: Negative for pain, discharge and redness.  Respiratory: Negative for cough, shortness of breath and wheezing.   Cardiovascular: Negative for chest pain, palpitations, orthopnea, claudication, leg swelling and PND.  Gastrointestinal: Negative for heartburn, nausea, vomiting, abdominal pain, diarrhea, constipation and blood in stool.  Genitourinary: Negative for dysuria, urgency, hematuria and flank pain. Frequency: incontinent of bladder.  Musculoskeletal: Positive for falls. Negative for back pain, joint pain, myalgias and neck pain.       Leaning to her right with RUE weaker than the left. Wandering and found sitting on floor in other resident's rooms--lack of safety awareness  Skin: Negative for itching and rash.       Left heel and coccyx redness  Neurological: Negative for dizziness, sensory change, speech change, focal weakness, seizures, loss of consciousness, weakness and headaches.       Resolved an episode of unresponsiveness.   Endo/Heme/Allergies: Negative for environmental allergies and polydipsia. Does not bruise/bleed easily.  Psychiatric/Behavioral: Positive for memory loss. Negative for depression and hallucinations. The patient is nervous/anxious. The patient does not have insomnia.        Cursing, hitting, kicking, combative, angry, refusal of food/meds      Past Medical History  Diagnosis Date  . Depression   . Heart murmur   . Hypertension   . Diabetes mellitus without complication   .  CHF (congestive heart failure)   . Anxiety   . GERD (gastroesophageal reflux disease)   . Bilateral breast cancer 2004  . Degeneration of intervertebral disc, site unspecified   . Lumbago   . Disorder of bone and cartilage, unspecified   . Debility, unspecified   . Senile dementia with depressive features   . Unspecified hypothyroidism   . Other and unspecified hyperlipidemia   . Atrial fibrillation   .  Unspecified constipation   . Hydronephrosis   . Senile osteoporosis   . Memory loss   . Closed fracture of anatomical neck of humerus   . Hypopotassemia   . Hydronephrosis    Medications: Patient's Medications  New Prescriptions   No medications on file  Previous Medications   ACETAMINOPHEN (TYLENOL) 500 MG TABLET    Take 1,000 mg by mouth 3 (three) times daily.    AMBULATORY NON FORMULARY MEDICATION    Lorazepam 1mg /ml Gel Sig: Apply 65ml topically every 6 hours as needed for agitation   AMITRIPTYLINE (ELAVIL) 25 MG TABLET    Take 25 mg by mouth at bedtime.   DIVALPROEX (DEPAKOTE) 125 MG DR TABLET    Take 125 mg by mouth 2 (two) times daily.   ENALAPRIL (VASOTEC) 20 MG TABLET    Take 40 mg by mouth daily.   GLIMEPIRIDE (AMARYL) 1 MG TABLET    Take 1 mg by mouth daily with breakfast.   GLUCAGON HCL, RDNA, (GLUCAGEN IJ)    Inject as directed. 1 mg SQ/IV every 20 KITS minutes as needed for CBG <20 and unable to eat and drink   LEVOTHYROXINE (SYNTHROID, LEVOTHROID) 50 MCG TABLET    Take 50 mcg by mouth daily before breakfast.   LORAZEPAM (ATIVAN) 0.5 MG TABLET    Take 1 tablet (0.5 mg total) by mouth every 6 (six) hours as needed for anxiety. Take one tablet by mouth every 6 hours as needed for agitation/anxiety.   LUBIPROSTONE (AMITIZA) 24 MCG CAPSULE    Take 24 mcg by mouth 2 (two) times daily with a meal.   METOPROLOL TARTRATE (LOPRESSOR) 25 MG TABLET    Take 25 mg by mouth 2 (two) times daily.   PANTOPRAZOLE (PROTONIX) 40 MG TABLET    Take 1 tablet (40 mg total) by mouth 2 (two) times daily.   POLYETHYLENE GLYCOL (MIRALAX / GLYCOLAX) PACKET    Take 17 g by mouth daily. Fill cap to 17 GM, mix 4-8 ounces of liquid by mouth at bedtime   PRESCRIPTION MEDICATION    Apply 1 mL topically every 6 (six) hours as needed (for agitation). Lorazepam 1mg /ml gel   SENNOSIDES-DOCUSATE SODIUM (SENOKOT-S) 8.6-50 MG TABLET    Take 3 tablets by mouth daily.   Modified Medications   No medications on file    Discontinued Medications   OLANZAPINE (ZYPREXA) 7.5 MG TABLET    Take 7.5 mg by mouth at bedtime.     Physical Exam: Physical Exam  Constitutional: She is oriented to person, place, and time. She appears well-developed and well-nourished.  HENT:  Head: Normocephalic and atraumatic.  Eyes: Conjunctivae and EOM are normal. Pupils are equal, round, and reactive to light.  Neck: Normal range of motion. Neck supple. No JVD present. No thyromegaly present.  Cardiovascular: Normal rate and regular rhythm.   Murmur heard.  Systolic murmur is present with a grade of 3/6  3/6  Pulmonary/Chest: Effort normal and breath sounds normal. She has no wheezes. She has no rales.  Abdominal: Soft. Bowel sounds  are normal. She exhibits no shifting dullness, no distension and no mass. There is no tenderness. There is no rigidity, no rebound, no guarding, no CVA tenderness and negative Murphy's sign. No hernia.  Multiple external hemorrhoids.   Genitourinary: No vaginal discharge found.  Musculoskeletal: Normal range of motion. She exhibits no edema and no tenderness.  Leaning to her right while in w/c. The right hand grip strength 5/5 but weaker than the left.   Lymphadenopathy:    She has no cervical adenopathy.  Neurological: She is alert and oriented to person, place, and time. She has normal reflexes. No cranial nerve deficit. She exhibits normal muscle tone. Coordination normal.  Skin: No rash noted. No erythema.  R+L mastectomy scars. Left heel and coccyx un blanchable reddened area from pressure   Psychiatric: Her mood appears anxious (at times). Her affect is angry, labile and inappropriate. Her speech is not delayed and not slurred. She is agitated and aggressive. She is not actively hallucinating. Thought content is delusional. Thought content is not paranoid. Cognition and memory are impaired. She expresses impulsivity and inappropriate judgment. She exhibits abnormal recent memory and abnormal  remote memory.    Filed Vitals:   05/07/14 1343  BP: 158/84  Pulse: 86  Temp: 97.6 F (36.4 C)  TempSrc: Tympanic  Resp: 16  SpO2: 94%   Labs reviewed: Basic Metabolic Panel:  Recent Labs  06/14/13 07/22/13 1624 10/15/13 01/07/14  NA 137 140 139 140  K 4.3 4.0 4.3 4.3  CL  --  100  --   --   CO2  --  30  --   --   GLUCOSE  --  155*  --   --   BUN 16 25* 21 25*  CREATININE 1.0 1.07 0.9 1.2*  CALCIUM  --  9.6  --   --   TSH 1.03  --   --  1.38   Liver Function Tests:  Recent Labs  07/22/13 1624 10/15/13 01/07/14  AST 10 13 8*  ALT 10 10 8   ALKPHOS 57 58 54  BILITOT 0.3  --   --   PROT 6.8  --   --   ALBUMIN 3.4*  --   --    CBC:  Recent Labs  07/22/13 1624 10/15/13 01/07/14  WBC 11.6* 8.3 7.0  NEUTROABS 9.8*  --   --   HGB 13.4 14.3 13.5  HCT 41.4 42 40  MCV 93.2  --   --   PLT 283 287 298   Lipid Panel:  Recent Labs  08/16/13  CHOL 218*  HDL 40  LDLCALC 151  TRIG 133    Assessment/Plan Unspecified essential hypertension Controlled on Metoprolol 25mg  bid and Enalapril 40mg , off Furosemide 20mg . Bun/creat 25/1.17 01/07/14     Unspecified hypothyroidism takes Levothyroxine 65mcg,TSH 1.378 01/07/14   Type II or unspecified type diabetes mellitus with renal manifestations, uncontrolled Off Glimepiride 1mg  due to hypoglycemic episodes. Hgb A1c 5.9 04/15/14. Continue to monitor CBG.   Depressive disorder, not elsewhere classified 05/07/14 down Zyprexa to 5mg  qod until current supplies exhausted. Continue Amitriptyline 25mg  daily.    Unspecified constipation Managed with Amitiza 54mcg bid, MiraLax daily,  and Senokot S III daily     Memory loss Better with Depakote 125mg  bid since 04/19/13 for pm agitation/restlessness. Taper off Zyprexa due to her episode of unresponsiveness.   Unresponsive episode 05/06/14 an episode of unresponsiveness, no change in focal neurological symptoms,  trial of medication reduction-Taper off Zyprexa due to  her  episode of unresponsiveness. Continue to monitor the patient.   Decubitus ulcer of coccyx Reddened un blanchable area-pressure reduction and heavy barrier cream bid.     Family/ Staff Communication: observe the patient.   Goals of Care: SNF  Labs/tests ordered: none.

## 2014-05-07 NOTE — Assessment & Plan Note (Signed)
Managed with Amitiza 28mcg bid, MiraLax daily,  and Senokot S III daily

## 2014-05-07 NOTE — Assessment & Plan Note (Addendum)
05/06/14 an episode of unresponsiveness, no change in focal neurological symptoms,  trial of medication reduction-Taper off Zyprexa due to her episode of unresponsiveness. Continue to monitor the patient.

## 2014-05-07 NOTE — Assessment & Plan Note (Signed)
takes Levothyroxine 75mcg,TSH 1.378 01/07/14

## 2014-05-07 NOTE — Assessment & Plan Note (Signed)
Off Glimepiride 1mg  due to hypoglycemic episodes. Hgb A1c 5.9 04/15/14. Continue to monitor CBG.

## 2014-05-07 NOTE — Assessment & Plan Note (Signed)
Better with Depakote 125mg  bid since 04/19/13 for pm agitation/restlessness. Taper off Zyprexa due to her episode of unresponsiveness.

## 2014-05-07 NOTE — Assessment & Plan Note (Signed)
Controlled on Metoprolol 25mg  bid and Enalapril 40mg , off Furosemide 20mg . Bun/creat 25/1.17 01/07/14

## 2014-05-07 NOTE — Assessment & Plan Note (Signed)
Reddened un blanchable area-pressure reduction and heavy barrier cream bid.

## 2014-05-14 ENCOUNTER — Non-Acute Institutional Stay (SKILLED_NURSING_FACILITY): Payer: Medicare Other | Admitting: Nurse Practitioner

## 2014-05-14 ENCOUNTER — Encounter: Payer: Self-pay | Admitting: Nurse Practitioner

## 2014-05-14 DIAGNOSIS — IMO0002 Reserved for concepts with insufficient information to code with codable children: Secondary | ICD-10-CM

## 2014-05-14 DIAGNOSIS — R627 Adult failure to thrive: Secondary | ICD-10-CM

## 2014-05-14 DIAGNOSIS — I482 Chronic atrial fibrillation, unspecified: Secondary | ICD-10-CM

## 2014-05-14 DIAGNOSIS — F0391 Unspecified dementia with behavioral disturbance: Secondary | ICD-10-CM

## 2014-05-14 DIAGNOSIS — E1165 Type 2 diabetes mellitus with hyperglycemia: Secondary | ICD-10-CM

## 2014-05-14 DIAGNOSIS — I4891 Unspecified atrial fibrillation: Secondary | ICD-10-CM

## 2014-05-14 DIAGNOSIS — F3289 Other specified depressive episodes: Secondary | ICD-10-CM

## 2014-05-14 DIAGNOSIS — I10 Essential (primary) hypertension: Secondary | ICD-10-CM

## 2014-05-14 DIAGNOSIS — F329 Major depressive disorder, single episode, unspecified: Secondary | ICD-10-CM

## 2014-05-14 DIAGNOSIS — K219 Gastro-esophageal reflux disease without esophagitis: Secondary | ICD-10-CM

## 2014-05-14 DIAGNOSIS — F03918 Unspecified dementia, unspecified severity, with other behavioral disturbance: Secondary | ICD-10-CM

## 2014-05-14 DIAGNOSIS — E1129 Type 2 diabetes mellitus with other diabetic kidney complication: Secondary | ICD-10-CM

## 2014-05-14 DIAGNOSIS — E039 Hypothyroidism, unspecified: Secondary | ICD-10-CM

## 2014-05-14 DIAGNOSIS — K5900 Constipation, unspecified: Secondary | ICD-10-CM

## 2014-05-14 NOTE — Assessment & Plan Note (Signed)
Controlled on Metoprolol 25mg  bid and Enalapril 40mg , off Furosemide 20mg . Bun/creat 25/1.17 01/07/14

## 2014-05-14 NOTE — Assessment & Plan Note (Signed)
Stable on Protonix 40mg  bid.

## 2014-05-14 NOTE — Assessment & Plan Note (Signed)
Tylenol 500mg  tid is adequate for pain. 05/13/14 reduced to bid.

## 2014-05-14 NOTE — Assessment & Plan Note (Signed)
Rate controlled, continue Metoprolol, off Digoxin, monitor HR

## 2014-05-14 NOTE — Assessment & Plan Note (Signed)
Dc Amitriptyline 25mg  daily R>B

## 2014-05-14 NOTE — Assessment & Plan Note (Signed)
Off Glimepiride 1mg  due to hypoglycemic episodes. Hgb A1c 5.9 04/15/14. Continue to monitor CBG.

## 2014-05-14 NOTE — Assessment & Plan Note (Signed)
takes Levothyroxine 69mcg,TSH 1.378 01/07/14

## 2014-05-14 NOTE — Assessment & Plan Note (Signed)
Continue to decline. Difficulty taking po meds. Dc Miralax, Amitiza, Elavil.

## 2014-05-14 NOTE — Assessment & Plan Note (Signed)
Better with Depakote 125mg  bid since 04/19/13 for pm agitation/restlessness. Taper off Zyprexa due to her episode of unresponsiveness.

## 2014-05-14 NOTE — Progress Notes (Signed)
Patient ID: Selena Ward, female   DOB: 01/21/1918, 80 y.o.   MRN: [id number]   Code Status: DNR  Allergies  Allergen Reactions  . Niacin And Related   . Nsaids     Chief Complaint  Patient presents with  . Medical Management of Chronic Issues  . Acute Visit    FTT    HPI: Patient is a 79 y.o. female seen in the SNF at [facility] today for evaluation of an episode of FTT  and chronic medical conditions.  Problem List Items Addressed This Visit   Unspecified essential hypertension (Chronic)     Controlled on Metoprolol 25mg  bid and Enalapril 40mg , off Furosemide 20mg . Bun/creat 25/1.17 01/07/14     Unspecified hypothyroidism     takes Levothyroxine 74mcg,TSH 1.378 01/07/14      Type II or unspecified type diabetes mellitus with renal manifestations, uncontrolled     Off Glimepiride 1mg  due to hypoglycemic episodes. Hgb A1c 5.9 04/15/14. Continue to monitor CBG.     Depressive disorder, not elsewhere classified     Dc Amitriptyline 25mg  daily R>B      Atrial fibrillation     Rate controlled, continue Metoprolol, off Digoxin, monitor HR     Unspecified constipation     Dc MiraLax and Amitiza. Will try DulcoLax suppository q3 days if no BM    Degeneration of intervertebral disc, site unspecified     Tylenol 500mg  tid is adequate for pain. 05/13/14 reduced to bid.      GERD (gastroesophageal reflux disease)     Stable on Protonix 40mg  bid.     Relevant Medications      bisacodyl (DULCOLAX) 10 MG suppository   FTT (failure to thrive) in adult - Primary     Continue to decline. Difficulty taking po meds. Dc Miralax, Amitiza, Elavil.     Dementia with behavioral disturbance     Better with Depakote 125mg  bid since 04/19/13 for pm agitation/restlessness. Taper off Zyprexa due to her episode of unresponsiveness.         Review of Systems:  Review of Systems  Constitutional: Positive for weight loss. Negative for fever, chills, malaise/fatigue and diaphoresis.    HENT: Positive for hearing loss. Negative for congestion, ear pain and sore throat.   Eyes: Negative for pain, discharge and redness.  Respiratory: Negative for cough, shortness of breath and wheezing.   Cardiovascular: Negative for chest pain, palpitations, orthopnea, claudication, leg swelling and PND.  Gastrointestinal: Negative for heartburn, nausea, vomiting, abdominal pain, diarrhea, constipation and blood in stool.  Genitourinary: Negative for dysuria, urgency, hematuria and flank pain. Frequency: incontinent of bladder.  Musculoskeletal: Positive for falls. Negative for back pain, joint pain, myalgias and neck pain.       Leaning to her right with RUE weaker than the left. Wandering and found sitting on floor in other resident's rooms--lack of safety awareness  Skin: Negative for itching and rash.       Left heel and coccyx redness  Neurological: Negative for dizziness, sensory change, speech change, focal weakness, seizures, loss of consciousness, weakness and headaches.       Resolved an episode of unresponsiveness.   Endo/Heme/Allergies: Negative for environmental allergies and polydipsia. Does not bruise/bleed easily.  Psychiatric/Behavioral: Positive for memory loss. Negative for depression and hallucinations. The patient is nervous/anxious. The patient does not have insomnia.        Cursing, hitting, kicking, combative, angry, refusal of food/meds      Past Medical History  Diagnosis Date  . Depression   . Heart murmur   . Hypertension   . Diabetes mellitus without complication   . CHF (congestive heart failure)   . Anxiety   . GERD (gastroesophageal reflux disease)   . Bilateral breast cancer 2004  . Degeneration of intervertebral disc, site unspecified   . Lumbago   . Disorder of bone and cartilage, unspecified   . Debility, unspecified   . Senile dementia with depressive features   . Unspecified hypothyroidism   . Other and unspecified hyperlipidemia   . Atrial  fibrillation   . Unspecified constipation   . Hydronephrosis   . Senile osteoporosis   . Memory loss   . Closed fracture of anatomical neck of humerus   . Hypopotassemia   . Hydronephrosis    Medications: Patient's Medications  New Prescriptions   No medications on file  Previous Medications   ACETAMINOPHEN (TYLENOL) 500 MG TABLET    Take 1,000 mg by mouth 2 (two) times daily.    AMBULATORY NON FORMULARY MEDICATION    Lorazepam 1mg /ml Gel Sig: Apply 58ml topically every 6 hours as needed for agitation   BISACODYL (DULCOLAX) 10 MG SUPPOSITORY    Place 10 mg rectally as needed for moderate constipation.   DIVALPROEX (DEPAKOTE) 125 MG DR TABLET    Take 125 mg by mouth 2 (two) times daily.   ENALAPRIL (VASOTEC) 20 MG TABLET    Take 40 mg by mouth daily.   GLIMEPIRIDE (AMARYL) 1 MG TABLET    Take 1 mg by mouth daily with breakfast.   GLUCAGON HCL, RDNA, (GLUCAGEN IJ)    Inject as directed. 1 mg SQ/IV every 20 KITS minutes as needed for CBG <20 and unable to eat and drink   LEVOTHYROXINE (SYNTHROID, LEVOTHROID) 50 MCG TABLET    Take 50 mcg by mouth daily before breakfast.   LORAZEPAM (ATIVAN) 0.5 MG TABLET    Take 1 tablet (0.5 mg total) by mouth every 6 (six) hours as needed for anxiety. Take one tablet by mouth every 6 hours as needed for agitation/anxiety.   METOPROLOL TARTRATE (LOPRESSOR) 25 MG TABLET    Take 25 mg by mouth 2 (two) times daily.   PANTOPRAZOLE (PROTONIX) 40 MG TABLET    Take 1 tablet (40 mg total) by mouth 2 (two) times daily.   PRESCRIPTION MEDICATION    Apply 1 mL topically every 6 (six) hours as needed (for agitation). Lorazepam 1mg /ml gel   SENNOSIDES-DOCUSATE SODIUM (SENOKOT-S) 8.6-50 MG TABLET    Take 3 tablets by mouth daily.   Modified Medications   No medications on file  Discontinued Medications   AMITRIPTYLINE (ELAVIL) 25 MG TABLET    Take 25 mg by mouth at bedtime.   LUBIPROSTONE (AMITIZA) 24 MCG CAPSULE    Take 24 mcg by mouth 2 (two) times daily with a meal.    POLYETHYLENE GLYCOL (MIRALAX / GLYCOLAX) PACKET    Take 17 g by mouth daily. Fill cap to 17 GM, mix 4-8 ounces of liquid by mouth at bedtime     Physical Exam: Physical Exam  Constitutional: She is oriented to person, place, and time. She appears well-developed and well-nourished.  HENT:  Head: Normocephalic and atraumatic.  Eyes: Conjunctivae and EOM are normal. Pupils are equal, round, and reactive to light.  Neck: Normal range of motion. Neck supple. No JVD present. No thyromegaly present.  Cardiovascular: Normal rate and regular rhythm.   Murmur heard.  Systolic murmur is present with a grade  of 3/6  3/6  Pulmonary/Chest: Effort normal and breath sounds normal. She has no wheezes. She has no rales.  Abdominal: Soft. Bowel sounds are normal. She exhibits no shifting dullness, no distension and no mass. There is no tenderness. There is no rigidity, no rebound, no guarding, no CVA tenderness and negative Murphy's sign. No hernia.  Multiple external hemorrhoids.   Genitourinary: No vaginal discharge found.  Musculoskeletal: Normal range of motion. She exhibits no edema and no tenderness.  Leaning to her right while in w/c. The right hand grip strength 5/5 but weaker than the left.   Lymphadenopathy:    She has no cervical adenopathy.  Neurological: She is alert and oriented to person, place, and time. She has normal reflexes. No cranial nerve deficit. She exhibits normal muscle tone. Coordination normal.  Skin: No rash noted. No erythema.  R+L mastectomy scars. Left heel and coccyx un blanchable reddened area from pressure   Psychiatric: Her mood appears anxious (at times). Her affect is angry, labile and inappropriate. Her speech is not delayed and not slurred. She is agitated and aggressive. She is not actively hallucinating. Thought content is delusional. Thought content is not paranoid. Cognition and memory are impaired. She expresses impulsivity and inappropriate judgment. She  exhibits abnormal recent memory and abnormal remote memory.    Filed Vitals:   05/14/14 1323  BP: 106/74  Pulse: 86  Temp: 96.9 F (36.1 C)  TempSrc: Tympanic  Resp: 16   Labs reviewed: Basic Metabolic Panel:  Recent Labs  06/14/13 07/22/13 1624 10/15/13 01/07/14  NA 137 140 139 140  K 4.3 4.0 4.3 4.3  CL  --  100  --   --   CO2  --  30  --   --   GLUCOSE  --  155*  --   --   BUN 16 25* 21 25*  CREATININE 1.0 1.07 0.9 1.2*  CALCIUM  --  9.6  --   --   TSH 1.03  --   --  1.38   Liver Function Tests:  Recent Labs  07/22/13 1624 10/15/13 01/07/14  AST 10 13 8*  ALT 10 10 8   ALKPHOS 57 58 54  BILITOT 0.3  --   --   PROT 6.8  --   --   ALBUMIN 3.4*  --   --    CBC:  Recent Labs  07/22/13 1624 10/15/13 01/07/14  WBC 11.6* 8.3 7.0  NEUTROABS 9.8*  --   --   HGB 13.4 14.3 13.5  HCT 41.4 42 40  MCV 93.2  --   --   PLT 283 287 298   Lipid Panel:  Recent Labs  08/16/13  CHOL 218*  HDL 40  LDLCALC 151  TRIG 133    Assessment/Plan FTT (failure to thrive) in adult Continue to decline. Difficulty taking po meds. Dc Miralax, Amitiza, Elavil.   Unspecified constipation Dc MiraLax and Amitiza. Will try DulcoLax suppository q3 days if no BM  Unspecified essential hypertension Controlled on Metoprolol 25mg  bid and Enalapril 40mg , off Furosemide 20mg . Bun/creat 25/1.17 01/07/14   Unspecified hypothyroidism takes Levothyroxine 79mcg,TSH 1.378 01/07/14    Type II or unspecified type diabetes mellitus with renal manifestations, uncontrolled Off Glimepiride 1mg  due to hypoglycemic episodes. Hgb A1c 5.9 04/15/14. Continue to monitor CBG.   Atrial fibrillation Rate controlled, continue Metoprolol, off Digoxin, monitor HR   Dementia with behavioral disturbance Better with Depakote 125mg  bid since 04/19/13 for pm agitation/restlessness. Taper off Zyprexa due  to her episode of unresponsiveness.    Depressive disorder, not elsewhere classified Dc Amitriptyline  25mg  daily R>B    Degeneration of intervertebral disc, site unspecified Tylenol 500mg  tid is adequate for pain. 05/13/14 reduced to bid.    GERD (gastroesophageal reflux disease) Stable on Protonix 40mg  bid.     Family/ Staff Communication: observe the patient.   Goals of Care: SNF Hospice Service.   Labs/tests ordered: none.

## 2014-05-14 NOTE — Assessment & Plan Note (Signed)
Dc MiraLax and Amitiza. Will try DulcoLax suppository q3 days if no BM

## 2014-05-31 ENCOUNTER — Encounter: Payer: Self-pay | Admitting: Nurse Practitioner

## 2014-05-31 ENCOUNTER — Non-Acute Institutional Stay (SKILLED_NURSING_FACILITY): Payer: Medicare Other | Admitting: Nurse Practitioner

## 2014-05-31 DIAGNOSIS — W19XXXS Unspecified fall, sequela: Secondary | ICD-10-CM

## 2014-05-31 DIAGNOSIS — F03918 Unspecified dementia, unspecified severity, with other behavioral disturbance: Secondary | ICD-10-CM

## 2014-05-31 DIAGNOSIS — T7589XS Other specified effects of external causes, sequela: Secondary | ICD-10-CM

## 2014-05-31 DIAGNOSIS — K219 Gastro-esophageal reflux disease without esophagitis: Secondary | ICD-10-CM

## 2014-05-31 DIAGNOSIS — F329 Major depressive disorder, single episode, unspecified: Secondary | ICD-10-CM

## 2014-05-31 DIAGNOSIS — F3289 Other specified depressive episodes: Secondary | ICD-10-CM

## 2014-05-31 DIAGNOSIS — K5900 Constipation, unspecified: Secondary | ICD-10-CM

## 2014-05-31 DIAGNOSIS — F0391 Unspecified dementia with behavioral disturbance: Secondary | ICD-10-CM

## 2014-05-31 DIAGNOSIS — E039 Hypothyroidism, unspecified: Secondary | ICD-10-CM

## 2014-05-31 DIAGNOSIS — I10 Essential (primary) hypertension: Secondary | ICD-10-CM

## 2014-05-31 NOTE — Assessment & Plan Note (Signed)
Better with Depakote 125mg  bid since 04/19/13 for pm agitation/restlessness. Off Zyprexa, Ativan, Amitriptyline.

## 2014-05-31 NOTE — Assessment & Plan Note (Signed)
Stable

## 2014-05-31 NOTE — Assessment & Plan Note (Addendum)
05/29/14 fell w/o apparent injury. Intensive supervision.

## 2014-05-31 NOTE — Assessment & Plan Note (Signed)
Stable on Protonix 40mg  bid.

## 2014-05-31 NOTE — Assessment & Plan Note (Signed)
off MiraLax and Amitiza. Continue DulcoLax suppository q3 days if no BM    

## 2014-05-31 NOTE — Progress Notes (Signed)
Patient ID: Selena Ward, female   DOB: 1917-11-04, 80 y.o.   MRN: [id number]   Code Status: DNR  Allergies  Allergen Reactions  . Niacin And Related   . Nsaids     Chief Complaint  Patient presents with  . Medical Management of Chronic Issues  . Acute Visit    s/p fall 05/29/14    HPI: Patient is a 80 y.o. female seen in the SNF at [facility] today for evaluation of s/p fall 05/29/14  and chronic medical conditions.  Problem List Items Addressed This Visit   Unspecified essential hypertension (Chronic)     Controlled on Metoprolol 25mg  bid and Enalapril 40mg , off Furosemide 20mg . Bun/creat 25/1.17 01/07/14      Unspecified hypothyroidism     takes Levothyroxine 49mcg,TSH 1.378 01/07/14       Depressive disorder, not elsewhere classified     Stable.     Unspecified constipation     off MiraLax and Amitiza. Continue DulcoLax suppository q3 days if no BM     Fall - Primary     05/29/14 fell w/o apparent injury. Intensive supervision.     GERD (gastroesophageal reflux disease)     Stable on Protonix 40mg  bid.     Dementia with behavioral disturbance     Better with Depakote 125mg  bid since 04/19/13 for pm agitation/restlessness. Off Zyprexa, Ativan, Amitriptyline.          Review of Systems:  Review of Systems  Constitutional: Positive for weight loss. Negative for fever, chills, malaise/fatigue and diaphoresis.  HENT: Positive for hearing loss. Negative for congestion, ear pain and sore throat.   Eyes: Negative for pain, discharge and redness.  Respiratory: Negative for cough, shortness of breath and wheezing.   Cardiovascular: Negative for chest pain, palpitations, orthopnea, claudication, leg swelling and PND.  Gastrointestinal: Negative for heartburn, nausea, vomiting, abdominal pain, diarrhea, constipation and blood in stool.  Genitourinary: Negative for dysuria, urgency, hematuria and flank pain. Frequency: incontinent of bladder.  Musculoskeletal:  Positive for falls. Negative for back pain, joint pain, myalgias and neck pain.       Leaning to her right with RUE weaker than the left. Fell again 05/29/14  Skin: Negative for itching and rash.       Left heel and coccyx redness  Neurological: Negative for dizziness, sensory change, speech change, focal weakness, seizures, loss of consciousness, weakness and headaches.       Resolved an episode of unresponsiveness.   Endo/Heme/Allergies: Negative for environmental allergies and polydipsia. Does not bruise/bleed easily.  Psychiatric/Behavioral: Positive for memory loss. Negative for depression and hallucinations. The patient is not nervous/anxious and does not have insomnia.        Cursing, hitting, kicking, combative, angry, refusal of food/meds-no longer problematic.      Past Medical History  Diagnosis Date  . Depression   . Heart murmur   . Hypertension   . Diabetes mellitus without complication   . CHF (congestive heart failure)   . Anxiety   . GERD (gastroesophageal reflux disease)   . Bilateral breast cancer 2004  . Degeneration of intervertebral disc, site unspecified   . Lumbago   . Disorder of bone and cartilage, unspecified   . Debility, unspecified   . Senile dementia with depressive features   . Unspecified hypothyroidism   . Other and unspecified hyperlipidemia   . Atrial fibrillation   . Unspecified constipation   . Hydronephrosis   . Senile osteoporosis   . Memory  loss   . Closed fracture of anatomical neck of humerus   . Hypopotassemia   . Hydronephrosis    Medications: Patient's Medications  New Prescriptions   No medications on file  Previous Medications   ACETAMINOPHEN (TYLENOL) 500 MG TABLET    Take 1,000 mg by mouth 2 (two) times daily.    AMBULATORY NON FORMULARY MEDICATION    Lorazepam 1mg /ml Gel Sig: Apply 89ml topically every 6 hours as needed for agitation   BISACODYL (DULCOLAX) 10 MG SUPPOSITORY    Place 10 mg rectally as needed for moderate  constipation.   DIVALPROEX (DEPAKOTE) 125 MG DR TABLET    Take 125 mg by mouth 2 (two) times daily.   ENALAPRIL (VASOTEC) 20 MG TABLET    Take 40 mg by mouth daily.   GLIMEPIRIDE (AMARYL) 1 MG TABLET    Take 1 mg by mouth daily with breakfast.   GLUCAGON HCL, RDNA, (GLUCAGEN IJ)    Inject as directed. 1 mg SQ/IV every 20 KITS minutes as needed for CBG <20 and unable to eat and drink   LEVOTHYROXINE (SYNTHROID, LEVOTHROID) 50 MCG TABLET    Take 50 mcg by mouth daily before breakfast.   LORAZEPAM (ATIVAN) 0.5 MG TABLET    Take 1 tablet (0.5 mg total) by mouth every 6 (six) hours as needed for anxiety. Take one tablet by mouth every 6 hours as needed for agitation/anxiety.   METOPROLOL TARTRATE (LOPRESSOR) 25 MG TABLET    Take 25 mg by mouth 2 (two) times daily.   PANTOPRAZOLE (PROTONIX) 40 MG TABLET    Take 1 tablet (40 mg total) by mouth 2 (two) times daily.   PRESCRIPTION MEDICATION    Apply 1 mL topically every 6 (six) hours as needed (for agitation). Lorazepam 1mg /ml gel   SENNOSIDES-DOCUSATE SODIUM (SENOKOT-S) 8.6-50 MG TABLET    Take 3 tablets by mouth daily.   Modified Medications   No medications on file  Discontinued Medications   No medications on file     Physical Exam: Physical Exam  Constitutional: She is oriented to person, place, and time. She appears well-developed and well-nourished.  HENT:  Head: Normocephalic and atraumatic.  Eyes: Conjunctivae and EOM are normal. Pupils are equal, round, and reactive to light.  Neck: Normal range of motion. Neck supple. No JVD present. No thyromegaly present.  Cardiovascular: Normal rate and regular rhythm.   Murmur heard.  Systolic murmur is present with a grade of 3/6  3/6  Pulmonary/Chest: Effort normal and breath sounds normal. She has no wheezes. She has no rales.  Abdominal: Soft. Bowel sounds are normal. She exhibits no shifting dullness, no distension and no mass. There is no tenderness. There is no rigidity, no rebound, no  guarding, no CVA tenderness and negative Murphy's sign. No hernia.  Multiple external hemorrhoids.   Genitourinary: No vaginal discharge found.  Musculoskeletal: Normal range of motion. She exhibits no edema and no tenderness.  Leaning to her right while in w/c. The right hand grip strength 5/5 but weaker than the left.   Lymphadenopathy:    She has no cervical adenopathy.  Neurological: She is alert and oriented to person, place, and time. She has normal reflexes. No cranial nerve deficit. She exhibits normal muscle tone. Coordination normal.  Skin: No rash noted. No erythema.  R+L mastectomy scars. Left heel and coccyx un blanchable reddened area from pressure   Psychiatric: Her mood appears anxious (at times). Her affect is angry, labile and inappropriate. Her speech is  not delayed and not slurred. She is agitated and aggressive. She is not actively hallucinating. Thought content is delusional. Thought content is not paranoid. Cognition and memory are impaired. She expresses impulsivity and inappropriate judgment. She exhibits abnormal recent memory and abnormal remote memory.    Filed Vitals:   05/31/14 1447  BP: 130/70  Pulse: 74  Temp: 99.1 F (37.3 C)  TempSrc: Tympanic  Resp: 18   Labs reviewed: Basic Metabolic Panel:  Recent Labs  06/14/13 07/22/13 1624 10/15/13 01/07/14  NA 137 140 139 140  K 4.3 4.0 4.3 4.3  CL  --  100  --   --   CO2  --  30  --   --   GLUCOSE  --  155*  --   --   BUN 16 25* 21 25*  CREATININE 1.0 1.07 0.9 1.2*  CALCIUM  --  9.6  --   --   TSH 1.03  --   --  1.38   Liver Function Tests:  Recent Labs  07/22/13 1624 10/15/13 01/07/14  AST 10 13 8*  ALT 10 10 8   ALKPHOS 57 58 54  BILITOT 0.3  --   --   PROT 6.8  --   --   ALBUMIN 3.4*  --   --    CBC:  Recent Labs  07/22/13 1624 10/15/13 01/07/14  WBC 11.6* 8.3 7.0  NEUTROABS 9.8*  --   --   HGB 13.4 14.3 13.5  HCT 41.4 42 40  MCV 93.2  --   --   PLT 283 287 298   Lipid  Panel:  Recent Labs  08/16/13  CHOL 218*  HDL 40  LDLCALC 151  TRIG 133    Assessment/Plan Fall 05/29/14 fell w/o apparent injury. Intensive supervision.   Dementia with behavioral disturbance Better with Depakote 125mg  bid since 04/19/13 for pm agitation/restlessness. Off Zyprexa, Ativan, Amitriptyline.     Unspecified constipation off MiraLax and Amitiza. Continue DulcoLax suppository q3 days if no BM   GERD (gastroesophageal reflux disease) Stable on Protonix 40mg  bid.   Unspecified essential hypertension Controlled on Metoprolol 25mg  bid and Enalapril 40mg , off Furosemide 20mg . Bun/creat 25/1.17 01/07/14    Unspecified hypothyroidism takes Levothyroxine 5mcg,TSH 1.378 01/07/14     Depressive disorder, not elsewhere classified Stable.     Family/ Staff Communication: observe the patient.   Goals of Care: SNF Hospice Service.   Labs/tests ordered: none.

## 2014-05-31 NOTE — Assessment & Plan Note (Signed)
Controlled on Metoprolol 25mg  bid and Enalapril 40mg , off Furosemide 20mg . Bun/creat 25/1.17 01/07/14

## 2014-05-31 NOTE — Assessment & Plan Note (Signed)
takes Levothyroxine 68mcg,TSH 1.378 01/07/14

## 2014-06-11 ENCOUNTER — Encounter: Payer: Self-pay | Admitting: Nurse Practitioner

## 2014-06-11 ENCOUNTER — Non-Acute Institutional Stay (SKILLED_NURSING_FACILITY): Payer: Medicare Other | Admitting: Nurse Practitioner

## 2014-06-11 DIAGNOSIS — E039 Hypothyroidism, unspecified: Secondary | ICD-10-CM

## 2014-06-11 DIAGNOSIS — I10 Essential (primary) hypertension: Secondary | ICD-10-CM

## 2014-06-11 DIAGNOSIS — I482 Chronic atrial fibrillation, unspecified: Secondary | ICD-10-CM

## 2014-06-11 DIAGNOSIS — I4891 Unspecified atrial fibrillation: Secondary | ICD-10-CM

## 2014-06-11 DIAGNOSIS — K219 Gastro-esophageal reflux disease without esophagitis: Secondary | ICD-10-CM

## 2014-06-11 DIAGNOSIS — F03918 Unspecified dementia, unspecified severity, with other behavioral disturbance: Secondary | ICD-10-CM

## 2014-06-11 DIAGNOSIS — K5900 Constipation, unspecified: Secondary | ICD-10-CM

## 2014-06-11 DIAGNOSIS — F0391 Unspecified dementia with behavioral disturbance: Secondary | ICD-10-CM

## 2014-06-11 NOTE — Assessment & Plan Note (Signed)
takes Levothyroxine 46mcg,TSH 1.378 01/07/14

## 2014-06-11 NOTE — Assessment & Plan Note (Signed)
Better with Depakote 125mg  bid since 04/19/13 for pm agitation/restlessness-will reduce to qam due to pm med refusal. Off Zyprexa, Ativan, Amitriptyline.

## 2014-06-11 NOTE — Progress Notes (Signed)
Patient ID: Selena Ward, female   DOB: [birth year], 80 y.o.   MRN: [id number]   Code Status: DNR  Allergies  Allergen Reactions  . Niacin And Related   . Nsaids     Chief Complaint  Patient presents with  . Medical Management of Chronic Issues  . Acute Visit    medication refusal.     HPI: Patient is a 80 y.o. female seen in the SNF at [facility] today for evaluation of medication reusal  and chronic medical conditions.  Problem List Items Addressed This Visit   None      Review of Systems:  Review of Systems  Constitutional: Positive for weight loss. Negative for fever, chills, malaise/fatigue and diaphoresis.  HENT: Positive for hearing loss. Negative for congestion, ear pain and sore throat.   Eyes: Negative for pain, discharge and redness.  Respiratory: Negative for cough, shortness of breath and wheezing.   Cardiovascular: Negative for chest pain, palpitations, orthopnea, claudication, leg swelling and PND.  Gastrointestinal: Negative for heartburn, nausea, vomiting, abdominal pain, diarrhea, constipation and blood in stool.  Genitourinary: Negative for dysuria, urgency, hematuria and flank pain. Frequency: incontinent of bladder.  Musculoskeletal: Positive for falls. Negative for back pain, joint pain, myalgias and neck pain.       Leaning to her right with RUE weaker than the left. Fell again 05/29/14  Skin: Negative for itching and rash.       Left heel and coccyx redness  Neurological: Negative for dizziness, sensory change, speech change, focal weakness, seizures, loss of consciousness, weakness and headaches.       Resolved an episode of unresponsiveness.   Endo/Heme/Allergies: Negative for environmental allergies and polydipsia. Does not bruise/bleed easily.  Psychiatric/Behavioral: Positive for memory loss. Negative for depression and hallucinations. The patient is not nervous/anxious and does not have insomnia.        Cursing, hitting, kicking, combative,  angry, refusal of food/meds-no longer problematic.      Past Medical History  Diagnosis Date  . Depression   . Heart murmur   . Hypertension   . Diabetes mellitus without complication   . CHF (congestive heart failure)   . Anxiety   . GERD (gastroesophageal reflux disease)   . Bilateral breast cancer 2004  . Degeneration of intervertebral disc, site unspecified   . Lumbago   . Disorder of bone and cartilage, unspecified   . Debility, unspecified   . Senile dementia with depressive features   . Unspecified hypothyroidism   . Other and unspecified hyperlipidemia   . Atrial fibrillation   . Unspecified constipation   . Hydronephrosis   . Senile osteoporosis   . Memory loss   . Closed fracture of anatomical neck of humerus   . Hypopotassemia   . Hydronephrosis    Medications: Patient's Medications  New Prescriptions   No medications on file  Previous Medications   ACETAMINOPHEN (TYLENOL) 500 MG TABLET    Take 1,000 mg by mouth 2 (two) times daily.    AMBULATORY NON FORMULARY MEDICATION    Lorazepam 1mg /ml Gel Sig: Apply 67ml topically every 6 hours as needed for agitation   BISACODYL (DULCOLAX) 10 MG SUPPOSITORY    Place 10 mg rectally as needed for moderate constipation.   DIVALPROEX (DEPAKOTE) 125 MG DR TABLET    Take 125 mg by mouth 2 (two) times daily.   ENALAPRIL (VASOTEC) 20 MG TABLET    Take 40 mg by mouth daily.   GLIMEPIRIDE (AMARYL) 1 MG  TABLET    Take 1 mg by mouth daily with breakfast.   GLUCAGON HCL, RDNA, (GLUCAGEN IJ)    Inject as directed. 1 mg SQ/IV every 20 KITS minutes as needed for CBG <20 and unable to eat and drink   LEVOTHYROXINE (SYNTHROID, LEVOTHROID) 50 MCG TABLET    Take 50 mcg by mouth daily before breakfast.   LORAZEPAM (ATIVAN) 0.5 MG TABLET    Take 1 tablet (0.5 mg total) by mouth every 6 (six) hours as needed for anxiety. Take one tablet by mouth every 6 hours as needed for agitation/anxiety.   METOPROLOL TARTRATE (LOPRESSOR) 25 MG TABLET     Take 25 mg by mouth 2 (two) times daily.   PANTOPRAZOLE (PROTONIX) 40 MG TABLET    Take 1 tablet (40 mg total) by mouth 2 (two) times daily.   PRESCRIPTION MEDICATION    Apply 1 mL topically every 6 (six) hours as needed (for agitation). Lorazepam 1mg /ml gel   SENNOSIDES-DOCUSATE SODIUM (SENOKOT-S) 8.6-50 MG TABLET    Take 3 tablets by mouth daily.   Modified Medications   No medications on file  Discontinued Medications   No medications on file     Physical Exam: Physical Exam  Constitutional: She is oriented to person, place, and time. She appears well-developed and well-nourished.  HENT:  Head: Normocephalic and atraumatic.  Eyes: Conjunctivae and EOM are normal. Pupils are equal, round, and reactive to light.  Neck: Normal range of motion. Neck supple. No JVD present. No thyromegaly present.  Cardiovascular: Normal rate and regular rhythm.   Murmur heard.  Systolic murmur is present with a grade of 3/6  3/6  Pulmonary/Chest: Effort normal and breath sounds normal. She has no wheezes. She has no rales.  Abdominal: Soft. Bowel sounds are normal. She exhibits no shifting dullness, no distension and no mass. There is no tenderness. There is no rigidity, no rebound, no guarding, no CVA tenderness and negative Murphy's sign. No hernia.  Multiple external hemorrhoids.   Genitourinary: No vaginal discharge found.  Musculoskeletal: Normal range of motion. She exhibits no edema and no tenderness.  Leaning to her right while in w/c. The right hand grip strength 5/5 but weaker than the left.   Lymphadenopathy:    She has no cervical adenopathy.  Neurological: She is alert and oriented to person, place, and time. She has normal reflexes. No cranial nerve deficit. She exhibits normal muscle tone. Coordination normal.  Skin: No rash noted. No erythema.  R+L mastectomy scars. Left heel and coccyx un blanchable reddened area from pressure   Psychiatric: Her mood appears anxious (at times). Her  affect is angry, labile and inappropriate. Her speech is not delayed and not slurred. She is agitated and aggressive. She is not actively hallucinating. Thought content is delusional. Thought content is not paranoid. Cognition and memory are impaired. She expresses impulsivity and inappropriate judgment. She exhibits abnormal recent memory and abnormal remote memory.    Filed Vitals:   06/11/14 1353  BP: 132/58  Pulse: 62  Temp: 97.1 F (36.2 C)  TempSrc: Tympanic  Resp: 18   Labs reviewed: Basic Metabolic Panel:  Recent Labs  06/14/13 07/22/13 1624 10/15/13 01/07/14  NA 137 140 139 140  K 4.3 4.0 4.3 4.3  CL  --  100  --   --   CO2  --  30  --   --   GLUCOSE  --  155*  --   --   BUN 16 25* 21 25*  CREATININE 1.0 1.07 0.9 1.2*  CALCIUM  --  9.6  --   --   TSH 1.03  --   --  1.38   Liver Function Tests:  Recent Labs  07/22/13 1624 10/15/13 01/07/14  AST 10 13 8*  ALT 10 10 8   ALKPHOS 57 58 54  BILITOT 0.3  --   --   PROT 6.8  --   --   ALBUMIN 3.4*  --   --    CBC:  Recent Labs  07/22/13 1624 10/15/13 01/07/14  WBC 11.6* 8.3 7.0  NEUTROABS 9.8*  --   --   HGB 13.4 14.3 13.5  HCT 41.4 42 40  MCV 93.2  --   --   PLT 283 287 298   Lipid Panel:  Recent Labs  08/16/13  CHOL 218*  HDL 40  LDLCALC 151  TRIG 133    Assessment/Plan No problem-specific assessment & plan notes found for this encounter.   Family/ Staff Communication: observe the patient.   Goals of Care: SNF Hospice Service.   Labs/tests ordered: none.

## 2014-06-11 NOTE — Assessment & Plan Note (Signed)
off MiraLax and Amitiza. Continue DulcoLax suppository q3 days if no BM    

## 2014-06-11 NOTE — Assessment & Plan Note (Signed)
Rate controlled, continue Metoprolol, off Digoxin, monitor HR

## 2014-06-11 NOTE — Assessment & Plan Note (Signed)
Stable.  Continue Omeprazole.

## 2014-06-11 NOTE — Assessment & Plan Note (Signed)
Controlled on Metoprolol 25mg  and Enalapril 40mg  daily, off Furosemide 20mg . Bun/creat 25/1.17 01/07/14

## 2014-07-02 ENCOUNTER — Encounter: Payer: Self-pay | Admitting: Nurse Practitioner

## 2014-07-02 ENCOUNTER — Non-Acute Institutional Stay (SKILLED_NURSING_FACILITY): Payer: Medicare Other | Admitting: Nurse Practitioner

## 2014-07-02 DIAGNOSIS — I10 Essential (primary) hypertension: Secondary | ICD-10-CM

## 2014-07-02 DIAGNOSIS — E039 Hypothyroidism, unspecified: Secondary | ICD-10-CM

## 2014-07-02 DIAGNOSIS — K219 Gastro-esophageal reflux disease without esophagitis: Secondary | ICD-10-CM

## 2014-07-02 DIAGNOSIS — K5900 Constipation, unspecified: Secondary | ICD-10-CM

## 2014-07-02 DIAGNOSIS — T7589XS Other specified effects of external causes, sequela: Secondary | ICD-10-CM

## 2014-07-02 DIAGNOSIS — F03918 Unspecified dementia, unspecified severity, with other behavioral disturbance: Secondary | ICD-10-CM

## 2014-07-02 DIAGNOSIS — W19XXXS Unspecified fall, sequela: Secondary | ICD-10-CM

## 2014-07-02 DIAGNOSIS — F0391 Unspecified dementia with behavioral disturbance: Secondary | ICD-10-CM

## 2014-07-02 DIAGNOSIS — R627 Adult failure to thrive: Secondary | ICD-10-CM

## 2014-07-02 NOTE — Progress Notes (Signed)
Patient ID: Selena Ward, female   DOB: [birth year], 80 y.o.   MRN: [id number]   Code Status: DNR  Allergies  Allergen Reactions  . Niacin And Related   . Nsaids     Chief Complaint  Patient presents with  . Medical Management of Chronic Issues  . Acute Visit    falling    HPI: Patient is a 80 y.o. female seen in the SNF at [facility] today for evaluation of s/p fall  and chronic medical conditions.  Problem List Items Addressed This Visit   Dementia with behavioral disturbance     Better with Depakote 125mg  bid since 04/19/13 and able to GDR to 125mg  daily. Off Zyprexa, Ativan, Amitriptyline.      Fall - Primary     07/02/14 fell: the resident was found on floor in upright position beside table in living room, sustained a small abrasion right back. Intensive supervision needed due to lack of safety awareness related to her advanced dementia and her physical frailty.      FTT (failure to thrive) in adult     Continue to decline. Update CBC    GERD (gastroesophageal reflux disease)     Stable. Continue Omeprazole.      Unspecified constipation     off MiraLax and Amitiza. Continue DulcoLax suppository q3 days if no BM       Unspecified essential hypertension (Chronic)     ontrolled on Metoprolol 25mg  and Enalapril 40mg  daily, off Furosemide 20mg . Bun/creat 25/1.17 01/07/14. Update  CMP      Unspecified hypothyroidism     takes Levothyroxine 67mcg,TSH 1.378 01/07/14. Update TSH          Review of Systems:  Review of Systems  Constitutional: Positive for weight loss. Negative for fever, chills, malaise/fatigue and diaphoresis.  HENT: Positive for hearing loss. Negative for congestion, ear pain and sore throat.   Eyes: Negative for pain, discharge and redness.  Respiratory: Negative for cough, shortness of breath and wheezing.   Cardiovascular: Negative for chest pain, palpitations, orthopnea, claudication, leg swelling and PND.  Gastrointestinal: Negative for  heartburn, nausea, vomiting, abdominal pain, diarrhea, constipation and blood in stool.  Genitourinary: Negative for dysuria, urgency, hematuria and flank pain. Frequency: incontinent of bladder.  Musculoskeletal: Positive for falls. Negative for back pain, joint pain, myalgias and neck pain.       Leaning to her right with RUE weaker than the left.  Fell 05/29/14. 07/02/14  Skin: Negative for itching and rash.       Left heel and coccyx redness  Neurological: Negative for dizziness, sensory change, speech change, focal weakness, seizures, loss of consciousness, weakness and headaches.       Resolved an episode of unresponsiveness.   Endo/Heme/Allergies: Negative for environmental allergies and polydipsia. Does not bruise/bleed easily.  Psychiatric/Behavioral: Positive for memory loss. Negative for depression and hallucinations. The patient is not nervous/anxious and does not have insomnia.        Cursing, hitting, kicking, combative, angry, refusal of food/meds-no longer problematic.      Past Medical History  Diagnosis Date  . Depression   . Heart murmur   . Hypertension   . Diabetes mellitus without complication   . CHF (congestive heart failure)   . Anxiety   . GERD (gastroesophageal reflux disease)   . Bilateral breast cancer 2004  . Degeneration of intervertebral disc, site unspecified   . Lumbago   . Disorder of bone and cartilage, unspecified   .  Debility, unspecified   . Senile dementia with depressive features   . Unspecified hypothyroidism   . Other and unspecified hyperlipidemia   . Atrial fibrillation   . Unspecified constipation   . Hydronephrosis   . Senile osteoporosis   . Memory loss   . Closed fracture of anatomical neck of humerus   . Hypopotassemia   . Hydronephrosis    Medications: Patient's Medications  New Prescriptions   No medications on file  Previous Medications   ACETAMINOPHEN (TYLENOL) 500 MG TABLET    Take 1,000 mg by mouth 2 (two) times daily.     AMBULATORY NON FORMULARY MEDICATION    Lorazepam 1mg /ml Gel Sig: Apply 31ml topically every 6 hours as needed for agitation   BISACODYL (DULCOLAX) 10 MG SUPPOSITORY    Place 10 mg rectally as needed for moderate constipation.   DIVALPROEX (DEPAKOTE) 125 MG DR TABLET    Take 125 mg by mouth daily.    ENALAPRIL (VASOTEC) 20 MG TABLET    Take 40 mg by mouth daily.   GLIMEPIRIDE (AMARYL) 1 MG TABLET    Take 1 mg by mouth daily with breakfast.   GLUCAGON HCL, RDNA, (GLUCAGEN IJ)    Inject as directed. 1 mg SQ/IV every 20 KITS minutes as needed for CBG <20 and unable to eat and drink   LEVOTHYROXINE (SYNTHROID, LEVOTHROID) 50 MCG TABLET    Take 50 mcg by mouth daily before breakfast.   LORAZEPAM (ATIVAN) 0.5 MG TABLET    Take 1 tablet (0.5 mg total) by mouth every 6 (six) hours as needed for anxiety. Take one tablet by mouth every 6 hours as needed for agitation/anxiety.   METOPROLOL TARTRATE (LOPRESSOR) 25 MG TABLET    Take 25 mg by mouth daily.    OMEPRAZOLE (PRILOSEC) 20 MG CAPSULE    Take 20 mg by mouth daily.   PRESCRIPTION MEDICATION    Apply 1 mL topically every 6 (six) hours as needed (for agitation). Lorazepam 1mg /ml gel   SENNOSIDES-DOCUSATE SODIUM (SENOKOT-S) 8.6-50 MG TABLET    Take 3 tablets by mouth daily.   Modified Medications   No medications on file  Discontinued Medications   No medications on file     Physical Exam: Physical Exam  Constitutional: She is oriented to person, place, and time. She appears well-developed and well-nourished.  HENT:  Head: Normocephalic and atraumatic.  Eyes: Conjunctivae and EOM are normal. Pupils are equal, round, and reactive to light.  Neck: Normal range of motion. Neck supple. No JVD present. No thyromegaly present.  Cardiovascular: Normal rate and regular rhythm.   Murmur heard.  Systolic murmur is present with a grade of 3/6  3/6  Pulmonary/Chest: Effort normal and breath sounds normal. She has no wheezes. She has no rales.    Abdominal: Soft. Bowel sounds are normal. She exhibits no shifting dullness, no distension and no mass. There is no tenderness. There is no rigidity, no rebound, no guarding, no CVA tenderness and negative Murphy's sign. No hernia.  Multiple external hemorrhoids.   Genitourinary: No vaginal discharge found.  Musculoskeletal: Normal range of motion. She exhibits no edema and no tenderness.  Leaning to her right while in w/c. The right hand grip strength 5/5 but weaker than the left.   Lymphadenopathy:    She has no cervical adenopathy.  Neurological: She is alert and oriented to person, place, and time. She has normal reflexes. No cranial nerve deficit. She exhibits normal muscle tone. Coordination normal.  Skin: No rash  noted. No erythema.  R+L mastectomy scars. Left heel and coccyx un blanchable reddened area from pressure   Psychiatric: Her mood appears anxious (at times). Her affect is angry, labile and inappropriate. Her speech is not delayed and not slurred. She is agitated and aggressive. She is not actively hallucinating. Thought content is delusional. Thought content is not paranoid. Cognition and memory are impaired. She expresses impulsivity and inappropriate judgment. She exhibits abnormal recent memory and abnormal remote memory.    Filed Vitals:   07/02/14 1206  BP: 120/70  Pulse: 70  Temp: 97.4 F (36.3 C)  TempSrc: Tympanic  Resp: 18   Labs reviewed: Basic Metabolic Panel:  Recent Labs  07/22/13 1624 10/15/13 01/07/14  NA 140 139 140  K 4.0 4.3 4.3  CL 100  --   --   CO2 30  --   --   GLUCOSE 155*  --   --   BUN 25* 21 25*  CREATININE 1.07 0.9 1.2*  CALCIUM 9.6  --   --   TSH  --   --  1.38   Liver Function Tests:  Recent Labs  07/22/13 1624 10/15/13 01/07/14  AST 10 13 8*  ALT 10 10 8   ALKPHOS 57 58 54  BILITOT 0.3  --   --   PROT 6.8  --   --   ALBUMIN 3.4*  --   --    CBC:  Recent Labs  07/22/13 1624 10/15/13 01/07/14  WBC 11.6* 8.3 7.0   NEUTROABS 9.8*  --   --   HGB 13.4 14.3 13.5  HCT 41.4 42 40  MCV 93.2  --   --   PLT 283 287 298   Lipid Panel:  Recent Labs  08/16/13  CHOL 218*  HDL 40  LDLCALC 151  TRIG 133    Assessment/Plan Fall 07/02/14 fell: the resident was found on floor in upright position beside table in living room, sustained a small abrasion right back. Intensive supervision needed due to lack of safety awareness related to her advanced dementia and her physical frailty.    GERD (gastroesophageal reflux disease) Stable. Continue Omeprazole.    Dementia with behavioral disturbance Better with Depakote 125mg  bid since 04/19/13 and able to GDR to 125mg  daily. Off Zyprexa, Ativan, Amitriptyline.    Unspecified hypothyroidism takes Levothyroxine 92mcg,TSH 1.378 01/07/14. Update TSH     Unspecified constipation off MiraLax and Amitiza. Continue DulcoLax suppository q3 days if no BM     Unspecified essential hypertension ontrolled on Metoprolol 25mg  and Enalapril 40mg  daily, off Furosemide 20mg . Bun/creat 25/1.17 01/07/14. Update  CMP    FTT (failure to thrive) in adult Continue to decline. Update CBC    Family/ Staff Communication: observe the patient.   Goals of Care: SNF Hospice Service.   Labs/tests ordered: none.

## 2014-07-02 NOTE — Assessment & Plan Note (Signed)
07/02/14 fell: the resident was found on floor in upright position beside table in living room, sustained a small abrasion right back. Intensive supervision needed due to lack of safety awareness related to her advanced dementia and her physical frailty.

## 2014-07-02 NOTE — Assessment & Plan Note (Signed)
Stable.  Continue Omeprazole.

## 2014-07-02 NOTE — Assessment & Plan Note (Signed)
Continue to decline. Update CBC

## 2014-07-02 NOTE — Assessment & Plan Note (Signed)
Better with Depakote 125mg  bid since 04/19/13 and able to GDR to 125mg  daily. Off Zyprexa, Ativan, Amitriptyline.

## 2014-07-02 NOTE — Assessment & Plan Note (Signed)
off MiraLax and Amitiza. Continue DulcoLax suppository q3 days if no BM    

## 2014-07-02 NOTE — Assessment & Plan Note (Signed)
ontrolled on Metoprolol 25mg  and Enalapril 40mg  daily, off Furosemide 20mg . Bun/creat 25/1.17 01/07/14. Update  CMP

## 2014-07-02 NOTE — Assessment & Plan Note (Signed)
takes Levothyroxine 64mcg,TSH 1.378 01/07/14. Update TSH

## 2014-07-04 LAB — CBC AND DIFFERENTIAL
HCT: 38 % (ref 36–46)
Hemoglobin: 12.5 g/dL (ref 12.0–16.0)
Platelets: 300 10*3/uL (ref 150–399)
WBC: 7.5 10*3/mL

## 2014-07-04 LAB — BASIC METABOLIC PANEL
BUN: 28 mg/dL — AB (ref 4–21)
CREATININE: 1 mg/dL (ref 0.5–1.1)
Glucose: 121 mg/dL
POTASSIUM: 4.1 mmol/L (ref 3.4–5.3)
Sodium: 132 mmol/L — AB (ref 137–147)

## 2014-07-04 LAB — HEPATIC FUNCTION PANEL
ALT: 13 U/L (ref 7–35)
AST: 16 U/L (ref 13–35)
Alkaline Phosphatase: 34 U/L (ref 25–125)
Bilirubin, Total: 0.4 mg/dL

## 2014-07-04 LAB — TSH: TSH: 2.55 u[IU]/mL (ref 0.41–5.90)

## 2014-07-05 ENCOUNTER — Non-Acute Institutional Stay (SKILLED_NURSING_FACILITY): Payer: Medicare Other | Admitting: Nurse Practitioner

## 2014-07-05 DIAGNOSIS — F03918 Unspecified dementia, unspecified severity, with other behavioral disturbance: Secondary | ICD-10-CM

## 2014-07-05 DIAGNOSIS — F0391 Unspecified dementia with behavioral disturbance: Secondary | ICD-10-CM

## 2014-07-05 DIAGNOSIS — K219 Gastro-esophageal reflux disease without esophagitis: Secondary | ICD-10-CM

## 2014-07-05 DIAGNOSIS — R627 Adult failure to thrive: Secondary | ICD-10-CM

## 2014-07-05 DIAGNOSIS — E871 Hypo-osmolality and hyponatremia: Secondary | ICD-10-CM

## 2014-07-05 DIAGNOSIS — I10 Essential (primary) hypertension: Secondary | ICD-10-CM

## 2014-07-05 DIAGNOSIS — K5900 Constipation, unspecified: Secondary | ICD-10-CM

## 2014-07-05 DIAGNOSIS — E039 Hypothyroidism, unspecified: Secondary | ICD-10-CM

## 2014-07-05 NOTE — Progress Notes (Signed)
Patient ID: Selena Ward, female   DOB: Feb 17, 1918, 80 y.o.   MRN: [id number]   Code Status: DNR  Allergies  Allergen Reactions  . Niacin And Related   . Nsaids     Chief Complaint  Patient presents with  . Medical Management of Chronic Issues    HPI: Patient is a 80 y.o. female seen in the SNF at [facility] today for evaluation of mild hyponatremia and chronic medical conditions.  Problem List Items Addressed This Visit   Dementia with behavioral disturbance     Better with Depakote 125mg  bid since 04/19/13 and able to GDR to 125mg  daily since 8/11/5. Off Zyprexa, Ativan, Amitriptyline.       FTT (failure to thrive) in adult     Continue to decline.      GERD (gastroesophageal reflux disease)     Stable. Continue Omeprazole.       Hyponatremia - Primary     Encourage oral intake, update BMP in 2 weeks. Na 132 07/03/14    Unspecified constipation     off MiraLax and Amitiza. Continue DulcoLax suppository q3 days if no BM     Unspecified essential hypertension (Chronic)     controlled on Metoprolol 25mg  and Enalapril 40mg  daily, off Furosemide 20mg . Bun/creat 25/1.17 01/07/14.        Unspecified hypothyroidism     takes Levothyroxine 41mcg,TSH 1.378 01/07/14- TSH 2.553 07/04/14         Review of Systems:  Review of Systems  Constitutional: Positive for weight loss. Negative for fever, chills, malaise/fatigue and diaphoresis.  HENT: Positive for hearing loss. Negative for congestion, ear pain and sore throat.   Eyes: Negative for pain, discharge and redness.  Respiratory: Negative for cough, shortness of breath and wheezing.   Cardiovascular: Negative for chest pain, palpitations, orthopnea, claudication, leg swelling and PND.  Gastrointestinal: Negative for heartburn, nausea, vomiting, abdominal pain, diarrhea, constipation and blood in stool.  Genitourinary: Negative for dysuria, urgency, hematuria and flank pain. Frequency: incontinent of bladder.    Musculoskeletal: Positive for falls. Negative for back pain, joint pain, myalgias and neck pain.       Leaning to her right with RUE weaker than the left.  Fell 05/29/14. 07/02/14  Skin: Negative for itching and rash.       Left heel and coccyx redness  Neurological: Negative for dizziness, sensory change, speech change, focal weakness, seizures, loss of consciousness, weakness and headaches.       Resolved an episode of unresponsiveness.   Endo/Heme/Allergies: Negative for environmental allergies and polydipsia. Does not bruise/bleed easily.  Psychiatric/Behavioral: Positive for memory loss. Negative for depression and hallucinations. The patient is not nervous/anxious and does not have insomnia.        Cursing, hitting, kicking, combative, angry, refusal of food/meds-no longer problematic.      Past Medical History  Diagnosis Date  . Depression   . Heart murmur   . Hypertension   . Diabetes mellitus without complication   . CHF (congestive heart failure)   . Anxiety   . GERD (gastroesophageal reflux disease)   . Bilateral breast cancer 2004  . Degeneration of intervertebral disc, site unspecified   . Lumbago   . Disorder of bone and cartilage, unspecified   . Debility, unspecified   . Senile dementia with depressive features   . Unspecified hypothyroidism   . Other and unspecified hyperlipidemia   . Atrial fibrillation   . Unspecified constipation   . Hydronephrosis   .  Senile osteoporosis   . Memory loss   . Closed fracture of anatomical neck of humerus   . Hypopotassemia   . Hydronephrosis    Medications: Patient's Medications  New Prescriptions   No medications on file  Previous Medications   ACETAMINOPHEN (TYLENOL) 500 MG TABLET    Take 1,000 mg by mouth 2 (two) times daily.    AMBULATORY NON FORMULARY MEDICATION    Lorazepam 1mg /ml Gel Sig: Apply 68ml topically every 6 hours as needed for agitation   BISACODYL (DULCOLAX) 10 MG SUPPOSITORY    Place 10 mg rectally as  needed for moderate constipation.   DIVALPROEX (DEPAKOTE) 125 MG DR TABLET    Take 125 mg by mouth daily.    ENALAPRIL (VASOTEC) 20 MG TABLET    Take 40 mg by mouth daily.   GLIMEPIRIDE (AMARYL) 1 MG TABLET    Take 1 mg by mouth daily with breakfast.   GLUCAGON HCL, RDNA, (GLUCAGEN IJ)    Inject as directed. 1 mg SQ/IV every 20 KITS minutes as needed for CBG <20 and unable to eat and drink   LEVOTHYROXINE (SYNTHROID, LEVOTHROID) 50 MCG TABLET    Take 50 mcg by mouth daily before breakfast.   LORAZEPAM (ATIVAN) 0.5 MG TABLET    Take 1 tablet (0.5 mg total) by mouth every 6 (six) hours as needed for anxiety. Take one tablet by mouth every 6 hours as needed for agitation/anxiety.   METOPROLOL TARTRATE (LOPRESSOR) 25 MG TABLET    Take 25 mg by mouth daily.    OMEPRAZOLE (PRILOSEC) 20 MG CAPSULE    Take 20 mg by mouth daily.   PRESCRIPTION MEDICATION    Apply 1 mL topically every 6 (six) hours as needed (for agitation). Lorazepam 1mg /ml gel   SENNOSIDES-DOCUSATE SODIUM (SENOKOT-S) 8.6-50 MG TABLET    Take 3 tablets by mouth daily.   Modified Medications   No medications on file  Discontinued Medications   No medications on file     Physical Exam: Physical Exam  Constitutional: She is oriented to person, place, and time. She appears well-developed and well-nourished.  HENT:  Head: Normocephalic and atraumatic.  Eyes: Conjunctivae and EOM are normal. Pupils are equal, round, and reactive to light.  Neck: Normal range of motion. Neck supple. No JVD present. No thyromegaly present.  Cardiovascular: Normal rate and regular rhythm.   Murmur heard.  Systolic murmur is present with a grade of 3/6  3/6  Pulmonary/Chest: Effort normal and breath sounds normal. She has no wheezes. She has no rales.  Abdominal: Soft. Bowel sounds are normal. She exhibits no shifting dullness, no distension and no mass. There is no tenderness. There is no rigidity, no rebound, no guarding, no CVA tenderness and negative  Murphy's sign. No hernia.  Multiple external hemorrhoids.   Genitourinary: No vaginal discharge found.  Musculoskeletal: Normal range of motion. She exhibits no edema and no tenderness.  Leaning to her right while in w/c. The right hand grip strength 5/5 but weaker than the left.   Lymphadenopathy:    She has no cervical adenopathy.  Neurological: She is alert and oriented to person, place, and time. She has normal reflexes. No cranial nerve deficit. She exhibits normal muscle tone. Coordination normal.  Skin: No rash noted. No erythema.  R+L mastectomy scars. Left heel and coccyx un blanchable reddened area from pressure   Psychiatric: Her mood appears anxious (at times). Her affect is angry, labile and inappropriate. Her speech is not delayed and not  slurred. She is agitated and aggressive. She is not actively hallucinating. Thought content is delusional. Thought content is not paranoid. Cognition and memory are impaired. She expresses impulsivity and inappropriate judgment. She exhibits abnormal recent memory and abnormal remote memory.    Filed Vitals:   07/05/14 1037  BP: 152/74  Pulse: 79  Temp: 97.3 F (36.3 C)  TempSrc: Tympanic  Resp: 20   Labs reviewed: Basic Metabolic Panel:  Recent Labs  07/22/13 1624 10/15/13 01/07/14 07/04/14  NA 140 139 140 132*  K 4.0 4.3 4.3 4.1  CL 100  --   --   --   CO2 30  --   --   --   GLUCOSE 155*  --   --   --   BUN 25* 21 25* 28*  CREATININE 1.07 0.9 1.2* 1.0  CALCIUM 9.6  --   --   --   TSH  --   --  1.38 2.55   Liver Function Tests:  Recent Labs  07/22/13 1624 10/15/13 01/07/14 07/04/14  AST 10 13 8* 16  ALT 10 10 8 13   ALKPHOS 57 58 54 34  BILITOT 0.3  --   --   --   PROT 6.8  --   --   --   ALBUMIN 3.4*  --   --   --    CBC:  Recent Labs  07/22/13 1624 10/15/13 01/07/14 07/04/14  WBC 11.6* 8.3 7.0 7.5  NEUTROABS 9.8*  --   --   --   HGB 13.4 14.3 13.5 12.5  HCT 41.4 42 40 38  MCV 93.2  --   --   --   PLT 283  287 298 300   Lipid Panel:  Recent Labs  08/16/13  CHOL 218*  HDL 40  LDLCALC 151  TRIG 133    Assessment/Plan Hyponatremia Encourage oral intake, update BMP in 2 weeks. Na 132 07/03/14  Dementia with behavioral disturbance Better with Depakote 125mg  bid since 04/19/13 and able to GDR to 125mg  daily since 8/11/5. Off Zyprexa, Ativan, Amitriptyline.     FTT (failure to thrive) in adult Continue to decline.    GERD (gastroesophageal reflux disease) Stable. Continue Omeprazole.     Unspecified constipation off MiraLax and Amitiza. Continue DulcoLax suppository q3 days if no BM   Unspecified hypothyroidism takes Levothyroxine 84mcg,TSH 1.378 01/07/14- TSH 2.553 07/04/14    Unspecified essential hypertension controlled on Metoprolol 25mg  and Enalapril 40mg  daily, off Furosemide 20mg . Bun/creat 25/1.17 01/07/14.        Family/ Staff Communication: observe the patient.   Goals of Care: SNF Hospice Service.   Labs/tests ordered: none.

## 2014-07-05 NOTE — Assessment & Plan Note (Signed)
Encourage oral intake, update BMP in 2 weeks. Na 132 07/03/14

## 2014-07-14 ENCOUNTER — Encounter: Payer: Self-pay | Admitting: Nurse Practitioner

## 2014-07-14 NOTE — Assessment & Plan Note (Signed)
Stable.  Continue Omeprazole.

## 2014-07-14 NOTE — Assessment & Plan Note (Signed)
takes Levothyroxine 65mcg,TSH 1.378 01/07/14- TSH 2.553 07/04/14

## 2014-07-14 NOTE — Assessment & Plan Note (Signed)
Better with Depakote 125mg bid since 04/19/13 and able to GDR to 125mg daily since 8/11/5. Off Zyprexa, Ativan, Amitriptyline.    

## 2014-07-14 NOTE — Assessment & Plan Note (Signed)
Continue to decline.  

## 2014-07-14 NOTE — Assessment & Plan Note (Signed)
off MiraLax and Amitiza. Continue DulcoLax suppository q3 days if no BM    

## 2014-07-14 NOTE — Assessment & Plan Note (Signed)
controlled on Metoprolol 25mg  and Enalapril 40mg  daily, off Furosemide 20mg . Bun/creat 25/1.17 01/07/14.

## 2014-07-18 LAB — BASIC METABOLIC PANEL
BUN: 26 mg/dL — AB (ref 4–21)
Creatinine: 0.8 mg/dL (ref 0.5–1.1)
Glucose: 163 mg/dL
POTASSIUM: 4.6 mmol/L (ref 3.4–5.3)
SODIUM: 134 mmol/L — AB (ref 137–147)

## 2014-07-19 ENCOUNTER — Other Ambulatory Visit: Payer: Self-pay | Admitting: Nurse Practitioner

## 2014-07-19 DIAGNOSIS — E871 Hypo-osmolality and hyponatremia: Secondary | ICD-10-CM

## 2014-08-23 ENCOUNTER — Encounter: Payer: Self-pay | Admitting: Nurse Practitioner

## 2014-08-23 ENCOUNTER — Non-Acute Institutional Stay (SKILLED_NURSING_FACILITY): Payer: Medicare Other | Admitting: Nurse Practitioner

## 2014-08-23 DIAGNOSIS — F03918 Unspecified dementia, unspecified severity, with other behavioral disturbance: Secondary | ICD-10-CM

## 2014-08-23 DIAGNOSIS — E871 Hypo-osmolality and hyponatremia: Secondary | ICD-10-CM

## 2014-08-23 DIAGNOSIS — R627 Adult failure to thrive: Secondary | ICD-10-CM

## 2014-08-23 DIAGNOSIS — I10 Essential (primary) hypertension: Secondary | ICD-10-CM

## 2014-08-23 DIAGNOSIS — N907 Vulvar cyst: Secondary | ICD-10-CM | POA: Insufficient documentation

## 2014-08-23 DIAGNOSIS — L89152 Pressure ulcer of sacral region, stage 2: Secondary | ICD-10-CM

## 2014-08-23 DIAGNOSIS — I482 Chronic atrial fibrillation, unspecified: Secondary | ICD-10-CM

## 2014-08-23 DIAGNOSIS — L723 Sebaceous cyst: Secondary | ICD-10-CM

## 2014-08-23 DIAGNOSIS — F0391 Unspecified dementia with behavioral disturbance: Secondary | ICD-10-CM

## 2014-08-23 DIAGNOSIS — E039 Hypothyroidism, unspecified: Secondary | ICD-10-CM

## 2014-08-23 DIAGNOSIS — K219 Gastro-esophageal reflux disease without esophagitis: Secondary | ICD-10-CM

## 2014-08-23 DIAGNOSIS — K5900 Constipation, unspecified: Secondary | ICD-10-CM

## 2014-08-23 NOTE — Assessment & Plan Note (Signed)
Numerous R+L labia majora-not infected.

## 2014-08-23 NOTE — Assessment & Plan Note (Signed)
controlled on Metoprolol 25mg  and Enalapril 40mg  daily, off Furosemide 20mg . Bun/creat 25/1.17 01/07/14.

## 2014-08-23 NOTE — Assessment & Plan Note (Signed)
Healed

## 2014-08-23 NOTE — Progress Notes (Signed)
Patient ID: Selena Ward, female   DOB: April 23, 1918, 80 y.o.   MRN: [id number]   Code Status: DNR  Allergies  Allergen Reactions  . Niacin And Related   . Nsaids     Chief Complaint  Patient presents with  . Medical Management of Chronic Issues  . Acute Visit    staff reported vaginal reddened and swellin areas    HPI: Patient is a 80 y.o. female seen in the SNF at [facility] today for evaluation of "reddened and swelling in vaginal areas" and chronic medical conditions.  Problem List Items Addressed This Visit   Sebaceous cyst of labia - Primary     Numerous R+L labia majora-not infected.        Review of Systems:  Review of Systems  Constitutional: Positive for weight loss. Negative for fever, chills, malaise/fatigue and diaphoresis.  HENT: Positive for hearing loss. Negative for congestion, ear pain and sore throat.   Eyes: Negative for pain, discharge and redness.  Respiratory: Negative for cough, shortness of breath and wheezing.   Cardiovascular: Negative for chest pain, palpitations, orthopnea, claudication, leg swelling and PND.  Gastrointestinal: Negative for heartburn, nausea, vomiting, abdominal pain, diarrhea, constipation and blood in stool.       Numerous sebaceous cysts R+L labia majora.   Genitourinary: Negative for dysuria, urgency, hematuria and flank pain. Frequency: incontinent of bladder.       Staff reported reddened and swelling in vaginal areas. Incontinent of B+B  Musculoskeletal: Positive for falls. Negative for back pain, joint pain, myalgias and neck pain.       Leaning to her right with RUE weaker than the left.  Fell 05/29/14. 07/02/14  Skin: Negative for itching and rash.       R+L mastectomies.   Neurological: Negative for dizziness, sensory change, speech change, focal weakness, seizures, loss of consciousness, weakness and headaches.       Resolved an episode of unresponsiveness.   Endo/Heme/Allergies: Negative for environmental  allergies and polydipsia. Does not bruise/bleed easily.  Psychiatric/Behavioral: Positive for memory loss. Negative for depression and hallucinations. The patient is not nervous/anxious and does not have insomnia.        Cursing, hitting, kicking, combative, angry, refusal of food/meds-no longer problematic.      Past Medical History  Diagnosis Date  . Depression   . Heart murmur   . Hypertension   . Diabetes mellitus without complication   . CHF (congestive heart failure)   . Anxiety   . GERD (gastroesophageal reflux disease)   . Bilateral breast cancer 2004  . Degeneration of intervertebral disc, site unspecified   . Lumbago   . Disorder of bone and cartilage, unspecified   . Debility, unspecified   . Senile dementia with depressive features   . Unspecified hypothyroidism   . Other and unspecified hyperlipidemia   . Atrial fibrillation   . Unspecified constipation   . Hydronephrosis   . Senile osteoporosis   . Memory loss   . Closed fracture of anatomical neck of humerus   . Hypopotassemia   . Hydronephrosis    Medications: Patient's Medications  New Prescriptions   No medications on file  Previous Medications   ACETAMINOPHEN (TYLENOL) 500 MG TABLET    Take 1,000 mg by mouth 2 (two) times daily.    AMBULATORY NON FORMULARY MEDICATION    Lorazepam 1mg /ml Gel Sig: Apply 73ml topically every 6 hours as needed for agitation   BISACODYL (DULCOLAX) 10 MG SUPPOSITORY  Place 10 mg rectally as needed for moderate constipation.   DIVALPROEX (DEPAKOTE) 125 MG DR TABLET    Take 125 mg by mouth daily.    ENALAPRIL (VASOTEC) 20 MG TABLET    Take 40 mg by mouth daily.   GLIMEPIRIDE (AMARYL) 1 MG TABLET    Take 1 mg by mouth daily with breakfast.   GLUCAGON HCL, RDNA, (GLUCAGEN IJ)    Inject as directed. 1 mg SQ/IV every 20 KITS minutes as needed for CBG <20 and unable to eat and drink   LEVOTHYROXINE (SYNTHROID, LEVOTHROID) 50 MCG TABLET    Take 50 mcg by mouth daily before  breakfast.   LORAZEPAM (ATIVAN) 0.5 MG TABLET    Take 1 tablet (0.5 mg total) by mouth every 6 (six) hours as needed for anxiety. Take one tablet by mouth every 6 hours as needed for agitation/anxiety.   METOPROLOL TARTRATE (LOPRESSOR) 25 MG TABLET    Take 25 mg by mouth daily.    OMEPRAZOLE (PRILOSEC) 20 MG CAPSULE    Take 20 mg by mouth daily.   PRESCRIPTION MEDICATION    Apply 1 mL topically every 6 (six) hours as needed (for agitation). Lorazepam 1mg /ml gel   SENNOSIDES-DOCUSATE SODIUM (SENOKOT-S) 8.6-50 MG TABLET    Take 3 tablets by mouth daily.   Modified Medications   No medications on file  Discontinued Medications   No medications on file     Physical Exam: Physical Exam  Constitutional: She is oriented to person, place, and time. She appears well-developed and well-nourished.  HENT:  Head: Normocephalic and atraumatic.  Eyes: Conjunctivae and EOM are normal. Pupils are equal, round, and reactive to light.  Neck: Normal range of motion. Neck supple. No JVD present. No thyromegaly present.  Cardiovascular: Normal rate and regular rhythm.   Murmur heard.  Systolic murmur is present with a grade of 3/6  3/6  Pulmonary/Chest: Effort normal and breath sounds normal. She has no wheezes. She has no rales.  Abdominal: Soft. Bowel sounds are normal. She exhibits no shifting dullness, no distension and no mass. There is no tenderness. There is no rigidity, no rebound, no guarding, no CVA tenderness and negative Murphy's sign. No hernia.  Multiple external hemorrhoids.   Genitourinary: Vagina normal. No vaginal discharge found.  Sebaceous cysts seen R+L labia majora. No reddened or swelling areas noted.   Musculoskeletal: Normal range of motion. She exhibits no edema and no tenderness.  Leaning to her right while in w/c. The right hand grip strength 5/5 but weaker than the left.   Lymphadenopathy:    She has no cervical adenopathy.  Neurological: She is alert and oriented to person,  place, and time. She has normal reflexes. No cranial nerve deficit. She exhibits normal muscle tone. Coordination normal.  Skin: No rash noted. No erythema.  R+L mastectomy scars.   Psychiatric: Her mood appears anxious (at times). Her affect is angry, labile and inappropriate. Her speech is not delayed and not slurred. She is agitated and aggressive. She is not actively hallucinating. Thought content is delusional. Thought content is not paranoid. Cognition and memory are impaired. She expresses impulsivity and inappropriate judgment. She exhibits abnormal recent memory and abnormal remote memory.    Filed Vitals:   08/23/14 1346  BP: 146/76  Pulse: 72  Temp: 97.4 F (36.3 C)  TempSrc: Tympanic  Resp: 20   Labs reviewed: Basic Metabolic Panel:  Recent Labs  01/07/14 07/04/14 07/18/14  NA 140 132* 134*  K 4.3 4.1  4.6  BUN 25* 28* 26*  CREATININE 1.2* 1.0 0.8  TSH 1.38 2.55  --    Liver Function Tests:  Recent Labs  10/15/13 01/07/14 07/04/14  AST 13 8* 16  ALT 10 8 13   ALKPHOS 58 54 34   CBC:  Recent Labs  10/15/13 01/07/14 07/04/14  WBC 8.3 7.0 7.5  HGB 14.3 13.5 12.5  HCT 42 40 38  PLT 287 298 300   Lipid Panel: No results found for this basename: CHOL, HDL, LDLCALC, TRIG, CHOLHDL, LDLDIRECT,  in the last 8760 hours  Assessment/Plan Sebaceous cyst of labia Numerous R+L labia majora-not infected.     Family/ Staff Communication: observe the patient.   Goals of Care: SNF Hospice Service.   Labs/tests ordered: none.

## 2014-08-23 NOTE — Assessment & Plan Note (Signed)
Continue to decline.  

## 2014-08-23 NOTE — Assessment & Plan Note (Signed)
takes Levothyroxine 32mcg,TSH 1.378 01/07/14- TSH 2.553 07/04/14

## 2014-08-23 NOTE — Assessment & Plan Note (Signed)
off MiraLax and Amitiza. Continue DulcoLax suppository q3 days if no BM 08/23/14 large soft BM today.

## 2014-08-23 NOTE — Assessment & Plan Note (Signed)
06/11/14 Metoprolol decreased from 25mg  bid to qam due to refusal.  08/23/14 controlled.

## 2014-08-23 NOTE — Assessment & Plan Note (Signed)
07/04/14 Na 132 07/18/14 Na 134

## 2014-08-23 NOTE — Assessment & Plan Note (Signed)
Stable.  Continue Omeprazole.

## 2014-08-23 NOTE — Assessment & Plan Note (Signed)
Better with Depakote 125mg bid since 04/19/13 and able to GDR to 125mg daily since 8/11/5. Off Zyprexa, Ativan, Amitriptyline.    

## 2014-09-10 ENCOUNTER — Non-Acute Institutional Stay (SKILLED_NURSING_FACILITY): Payer: Medicare Other | Admitting: Nurse Practitioner

## 2014-09-10 ENCOUNTER — Encounter: Payer: Self-pay | Admitting: Nurse Practitioner

## 2014-09-10 DIAGNOSIS — R627 Adult failure to thrive: Secondary | ICD-10-CM

## 2014-09-10 DIAGNOSIS — K219 Gastro-esophageal reflux disease without esophagitis: Secondary | ICD-10-CM

## 2014-09-10 DIAGNOSIS — E039 Hypothyroidism, unspecified: Secondary | ICD-10-CM

## 2014-09-10 DIAGNOSIS — I10 Essential (primary) hypertension: Secondary | ICD-10-CM

## 2014-09-10 DIAGNOSIS — I482 Chronic atrial fibrillation, unspecified: Secondary | ICD-10-CM

## 2014-09-10 DIAGNOSIS — K5900 Constipation, unspecified: Secondary | ICD-10-CM

## 2014-09-10 DIAGNOSIS — F0391 Unspecified dementia with behavioral disturbance: Secondary | ICD-10-CM

## 2014-09-10 DIAGNOSIS — F03918 Unspecified dementia, unspecified severity, with other behavioral disturbance: Secondary | ICD-10-CM

## 2014-09-10 DIAGNOSIS — E871 Hypo-osmolality and hyponatremia: Secondary | ICD-10-CM

## 2014-09-10 NOTE — Assessment & Plan Note (Signed)
off MiraLax and Amitiza. Continue DulcoLax suppository q3 days if no BM    

## 2014-09-10 NOTE — Assessment & Plan Note (Signed)
Controlled, Metoprolol 25mg , have to change Enalapril 40mg  daily to pm due to mild elevated Bp in am prior to meds and dropped to 80/50s after meds.  off Furosemide 20mg . Bun/creat 25/1.17 01/07/14.

## 2014-09-10 NOTE — Assessment & Plan Note (Signed)
takes Levothyroxine 82mcg,TSH 1.378 01/07/14- TSH 2.553 07/04/14

## 2014-09-10 NOTE — Assessment & Plan Note (Signed)
06/11/14 Metoprolol decreased from 25mg  bid to qam due to refusal.  08/23/14 controlled.

## 2014-09-10 NOTE — Assessment & Plan Note (Signed)
07/04/14 Na 132 07/18/14 Na 134

## 2014-09-10 NOTE — Progress Notes (Signed)
Patient ID: Selena Ward, female   DOB: 06-27-1918, 80 y.o.   MRN: [id number]   Code Status: DNR  Allergies  Allergen Reactions  . Niacin And Related   . Nsaids     Chief Complaint  Patient presents with  . Medical Management of Chronic Issues    HPI: Patient is a 80 y.o. female seen in the SNF at [facility] today for evaluation of chronic medical conditions.  Problem List Items Addressed This Visit    Hypothyroidism    takes Levothyroxine 35mcg,TSH 1.378 01/07/14- TSH 2.553 07/04/14      Hyponatremia    07/04/14 Na 132 07/18/14 Na 134      GERD (gastroesophageal reflux disease)    Stable. Continue Omeprazole.       FTT (failure to thrive) in adult    Continue to decline.        Essential hypertension - Primary    Controlled, Metoprolol 25mg , have to change Enalapril 40mg  daily to pm due to mild elevated Bp in am prior to meds and dropped to 80/50s after meds.  off Furosemide 20mg . Bun/creat 25/1.17 01/07/14.       Dementia with behavioral disturbance    Better with Depakote 125mg  bid since 04/19/13 and able to GDR to 125mg  daily since 8/11/5. Off Zyprexa, Ativan, Amitriptyline.       Constipation    off MiraLax and Amitiza. Continue DulcoLax suppository q3 days if no BM       Atrial fibrillation    06/11/14 Metoprolol decreased from 25mg  bid to qam due to refusal.  08/23/14 controlled.          Review of Systems:  Review of Systems  Constitutional: Positive for weight loss. Negative for fever, chills, malaise/fatigue and diaphoresis.  HENT: Positive for hearing loss. Negative for congestion, ear pain and sore throat.   Eyes: Negative for pain, discharge and redness.  Respiratory: Negative for cough, shortness of breath and wheezing.   Cardiovascular: Negative for chest pain, palpitations, orthopnea, claudication, leg swelling and PND.  Gastrointestinal: Negative for heartburn, nausea, vomiting, abdominal pain, diarrhea, constipation and blood in  stool.       Numerous sebaceous cysts R+L labia majora.   Genitourinary: Negative for dysuria, urgency, hematuria and flank pain. Frequency: incontinent of bladder.       Staff reported reddened and swelling in vaginal areas. Incontinent of B+B  Musculoskeletal: Positive for falls. Negative for myalgias, back pain, joint pain and neck pain.       Leaning to her right with RUE weaker than the left.  Fell 05/29/14. 07/02/14  Skin: Negative for itching and rash.       R+L mastectomies.   Neurological: Negative for dizziness, sensory change, speech change, focal weakness, seizures, loss of consciousness, weakness and headaches.       Resolved an episode of unresponsiveness.   Endo/Heme/Allergies: Negative for environmental allergies and polydipsia. Does not bruise/bleed easily.  Psychiatric/Behavioral: Positive for memory loss. Negative for depression and hallucinations. The patient is not nervous/anxious and does not have insomnia.        Cursing, hitting, kicking, combative, angry, refusal of food/meds-no longer problematic.      Past Medical History  Diagnosis Date  . Depression   . Heart murmur   . Hypertension   . Diabetes mellitus without complication   . CHF (congestive heart failure)   . Anxiety   . GERD (gastroesophageal reflux disease)   . Bilateral breast cancer 2004  . Degeneration  of intervertebral disc, site unspecified   . Lumbago   . Disorder of bone and cartilage, unspecified   . Debility, unspecified   . Senile dementia with depressive features   . Unspecified hypothyroidism   . Other and unspecified hyperlipidemia   . Atrial fibrillation   . Unspecified constipation   . Hydronephrosis   . Senile osteoporosis   . Memory loss   . Closed fracture of anatomical neck of humerus   . Hypopotassemia   . Hydronephrosis    Medications: Patient's Medications  New Prescriptions   No medications on file  Previous Medications   ACETAMINOPHEN (TYLENOL) 500 MG TABLET     Take 1,000 mg by mouth 2 (two) times daily.    AMBULATORY NON FORMULARY MEDICATION    Lorazepam 1mg /ml Gel Sig: Apply 55ml topically every 6 hours as needed for agitation   BISACODYL (DULCOLAX) 10 MG SUPPOSITORY    Place 10 mg rectally as needed for moderate constipation.   DIVALPROEX (DEPAKOTE) 125 MG DR TABLET    Take 125 mg by mouth daily.    ENALAPRIL (VASOTEC) 20 MG TABLET    Take 40 mg by mouth daily.   GLIMEPIRIDE (AMARYL) 1 MG TABLET    Take 1 mg by mouth daily with breakfast.   GLUCAGON HCL, RDNA, (GLUCAGEN IJ)    Inject as directed. 1 mg SQ/IV every 20 KITS minutes as needed for CBG <20 and unable to eat and drink   LEVOTHYROXINE (SYNTHROID, LEVOTHROID) 50 MCG TABLET    Take 50 mcg by mouth daily before breakfast.   LORAZEPAM (ATIVAN) 0.5 MG TABLET    Take 1 tablet (0.5 mg total) by mouth every 6 (six) hours as needed for anxiety. Take one tablet by mouth every 6 hours as needed for agitation/anxiety.   METOPROLOL TARTRATE (LOPRESSOR) 25 MG TABLET    Take 25 mg by mouth daily.    OMEPRAZOLE (PRILOSEC) 20 MG CAPSULE    Take 20 mg by mouth daily.   PRESCRIPTION MEDICATION    Apply 1 mL topically every 6 (six) hours as needed (for agitation). Lorazepam 1mg /ml gel   SENNOSIDES-DOCUSATE SODIUM (SENOKOT-S) 8.6-50 MG TABLET    Take 3 tablets by mouth daily.   Modified Medications   No medications on file  Discontinued Medications   No medications on file     Physical Exam: Physical Exam  Constitutional: She is oriented to person, place, and time. She appears well-developed and well-nourished.  HENT:  Head: Normocephalic and atraumatic.  Eyes: Conjunctivae and EOM are normal. Pupils are equal, round, and reactive to light.  Neck: Normal range of motion. Neck supple. No JVD present. No thyromegaly present.  Cardiovascular: Normal rate and regular rhythm.   Murmur heard.  Systolic murmur is present with a grade of 3/6  3/6  Pulmonary/Chest: Effort normal and breath sounds normal. She  has no wheezes. She has no rales.  Abdominal: Soft. Bowel sounds are normal. She exhibits no shifting dullness, no distension and no mass. There is no tenderness. There is no rigidity, no rebound, no guarding, no CVA tenderness and negative Murphy's sign. No hernia.  Previous exam: Multiple external hemorrhoids.   Genitourinary: Vagina normal. No vaginal discharge found.  Previous exam: Sebaceous cysts seen R+L labia majora. No reddened or swelling areas noted.   Musculoskeletal: Normal range of motion. She exhibits no edema or tenderness.  Leaning to her right while in w/c. The right hand grip strength 5/5 but weaker than the left.   Lymphadenopathy:  She has no cervical adenopathy.  Neurological: She is alert and oriented to person, place, and time. She has normal reflexes. No cranial nerve deficit. She exhibits normal muscle tone. Coordination normal.  Skin: No rash noted. No erythema.  R+L mastectomy scars.   Psychiatric: Her mood appears anxious (at times). Her affect is angry, labile and inappropriate. Her speech is not delayed and not slurred. She is agitated and aggressive. She is not actively hallucinating. Thought content is delusional. Thought content is not paranoid. Cognition and memory are impaired. She expresses impulsivity and inappropriate judgment. She exhibits abnormal recent memory and abnormal remote memory.    Filed Vitals:   09/10/14 1616  BP: 140/80  Pulse: 68  Temp: 98.4 F (36.9 C)  TempSrc: Tympanic  Resp: 16   Labs reviewed: Basic Metabolic Panel:  Recent Labs  01/07/14 07/04/14 07/18/14  NA 140 132* 134*  K 4.3 4.1 4.6  BUN 25* 28* 26*  CREATININE 1.2* 1.0 0.8  TSH 1.38 2.55  --    Liver Function Tests:  Recent Labs  10/15/13 01/07/14 07/04/14  AST 13 8* 16  ALT 10 8 13   ALKPHOS 58 54 34   CBC:  Recent Labs  10/15/13 01/07/14 07/04/14  WBC 8.3 7.0 7.5  HGB 14.3 13.5 12.5  HCT 42 40 38  PLT 287 298 300   Lipid Panel: No results for  input(s): CHOL, HDL, LDLCALC, TRIG, CHOLHDL, LDLDIRECT in the last 8760 hours.  Assessment/Plan Essential hypertension Controlled, Metoprolol 25mg , have to change Enalapril 40mg  daily to pm due to mild elevated Bp in am prior to meds and dropped to 80/50s after meds.  off Furosemide 20mg . Bun/creat 25/1.17 01/07/14.     Atrial fibrillation 06/11/14 Metoprolol decreased from 25mg  bid to qam due to refusal.  08/23/14 controlled.     Constipation off MiraLax and Amitiza. Continue DulcoLax suppository q3 days if no BM     Dementia with behavioral disturbance Better with Depakote 125mg  bid since 04/19/13 and able to GDR to 125mg  daily since 8/11/5. Off Zyprexa, Ativan, Amitriptyline.     FTT (failure to thrive) in adult Continue to decline.      GERD (gastroesophageal reflux disease) Stable. Continue Omeprazole.     Hyponatremia 07/04/14 Na 132 07/18/14 Na 134    Hypothyroidism takes Levothyroxine 81mcg,TSH 1.378 01/07/14- TSH 2.553 07/04/14      Family/ Staff Communication: observe the patient.   Goals of Care: SNF Hospice Service.   Labs/tests ordered: none.

## 2014-09-10 NOTE — Assessment & Plan Note (Signed)
Better with Depakote 125mg bid since 04/19/13 and able to GDR to 125mg daily since 8/11/5. Off Zyprexa, Ativan, Amitriptyline.    

## 2014-09-10 NOTE — Assessment & Plan Note (Signed)
Continue to decline.  

## 2014-09-10 NOTE — Assessment & Plan Note (Signed)
Stable.  Continue Omeprazole.

## 2014-09-13 ENCOUNTER — Encounter: Payer: Self-pay | Admitting: *Deleted

## 2014-10-03 ENCOUNTER — Other Ambulatory Visit: Payer: Self-pay | Admitting: *Deleted

## 2014-10-03 MED ORDER — LORAZEPAM 0.5 MG PO TABS: ORAL_TABLET | ORAL | Status: AC

## 2014-10-03 NOTE — Telephone Encounter (Signed)
[facility] [place] 

## 2014-10-10 ENCOUNTER — Encounter: Payer: Self-pay | Admitting: Nurse Practitioner

## 2014-10-10 ENCOUNTER — Non-Acute Institutional Stay (SKILLED_NURSING_FACILITY): Payer: Medicare Other | Admitting: Nurse Practitioner

## 2014-10-10 DIAGNOSIS — E1151 Type 2 diabetes mellitus with diabetic peripheral angiopathy without gangrene: Secondary | ICD-10-CM

## 2014-10-10 DIAGNOSIS — F0391 Unspecified dementia with behavioral disturbance: Secondary | ICD-10-CM

## 2014-10-10 DIAGNOSIS — K5900 Constipation, unspecified: Secondary | ICD-10-CM

## 2014-10-10 DIAGNOSIS — I482 Chronic atrial fibrillation, unspecified: Secondary | ICD-10-CM

## 2014-10-10 DIAGNOSIS — E039 Hypothyroidism, unspecified: Secondary | ICD-10-CM

## 2014-10-10 DIAGNOSIS — K219 Gastro-esophageal reflux disease without esophagitis: Secondary | ICD-10-CM

## 2014-10-10 DIAGNOSIS — F03918 Unspecified dementia, unspecified severity, with other behavioral disturbance: Secondary | ICD-10-CM

## 2014-10-10 DIAGNOSIS — E871 Hypo-osmolality and hyponatremia: Secondary | ICD-10-CM

## 2014-10-10 DIAGNOSIS — I10 Essential (primary) hypertension: Secondary | ICD-10-CM

## 2014-10-10 DIAGNOSIS — L309 Dermatitis, unspecified: Secondary | ICD-10-CM | POA: Insufficient documentation

## 2014-10-10 DIAGNOSIS — E1159 Type 2 diabetes mellitus with other circulatory complications: Secondary | ICD-10-CM

## 2014-10-10 NOTE — Progress Notes (Signed)
Patient ID: Selena Ward, female   DOB: 1918/03/07, 80 y.o.   MRN: [id number]   Code Status: DNR  Allergies  Allergen Reactions  . Niacin And Related   . Nsaids     Chief Complaint  Patient presents with  . Medical Management of Chronic Issues    HPI: Patient is a 80 y.o. female seen in the SNF at [facility] today for evaluation of itching rash mid back and chronic medical conditions.  Problem List Items Addressed This Visit    Type 2 diabetes mellitus with vascular disease    04/15/14 Hgb A1c 5.9 09/10/14 dc CBG per POA     Hypothyroidism    TSH 1.378 01/07/14 07/04/14 TSH 2.553 09/10/14 dc Synthroid per POA       Hyponatremia    07/04/14 Na 132 07/18/14 Na 134       GERD (gastroesophageal reflux disease)    Stable. Continue Omeprazole.       Essential hypertension    Controlled, Metoprolol 25mg , have to change Enalapril 40mg  daily to pm due to mild elevated Bp in am        Dermatitis - Primary   Dementia with behavioral disturbance    Better with Depakote 125mg  bid since 04/19/13 and able to GDR to 125mg  daily since 8/11/5. Off Zyprexa, Ativan, Amitriptyline.        Constipation    off MiraLax and Amitiza. Continue DulcoLax suppository q3 days if no BM      Atrial fibrillation    06/11/14 Metoprolol decreased from 25mg  bid to qam due to refusal.  08/23/14 controlled.  10/04/14 heart rate controlled.          Review of Systems:  Review of Systems  Constitutional: Positive for weight loss. Negative for fever, chills, malaise/fatigue and diaphoresis.  HENT: Positive for hearing loss. Negative for congestion, ear pain and sore throat.   Eyes: Negative for pain, discharge and redness.  Respiratory: Negative for cough, shortness of breath and wheezing.   Cardiovascular: Negative for chest pain, palpitations, orthopnea, claudication, leg swelling and PND.  Gastrointestinal: Negative for heartburn, nausea, vomiting, abdominal pain, diarrhea,  constipation and blood in stool.       Numerous sebaceous cysts R+L labia majora.   Genitourinary: Negative for dysuria, urgency, hematuria and flank pain. Frequency: incontinent of bladder.       Staff reported reddened and swelling in vaginal areas. Incontinent of B+B  Musculoskeletal: Positive for falls. Negative for myalgias, back pain, joint pain and neck pain.       Leaning to her right with RUE weaker than the left.  [place] 05/29/14. 07/02/14  Skin: Positive for itching and rash.       R+L mastectomies.  Mid to lower back itching rash  Neurological: Negative for dizziness, sensory change, speech change, focal weakness, seizures, loss of consciousness, weakness and headaches.       Resolved an episode of unresponsiveness.   Endo/Heme/Allergies: Negative for environmental allergies and polydipsia. Does not bruise/bleed easily.  Psychiatric/Behavioral: Positive for memory loss. Negative for depression and hallucinations. The patient is not nervous/anxious and does not have insomnia.        Cursing, hitting, kicking, combative, angry, refusal of food/meds-no longer problematic.      Past Medical History  Diagnosis Date  . Depression   . Heart murmur   . Hypertension   . Diabetes mellitus without complication   . CHF (congestive heart failure)   . Anxiety   . GERD (  gastroesophageal reflux disease)   . Bilateral breast cancer 2004  . Degeneration of intervertebral disc, site unspecified   . Lumbago   . Disorder of bone and cartilage, unspecified   . Debility, unspecified   . Senile dementia with depressive features   . Unspecified hypothyroidism   . Other and unspecified hyperlipidemia   . Atrial fibrillation   . Unspecified constipation   . Hydronephrosis   . Senile osteoporosis   . Memory loss   . Closed fracture of anatomical neck of humerus   . Hypopotassemia   . Hydronephrosis    Medications: Patient's Medications  New Prescriptions   No medications on file  Previous  Medications   ACETAMINOPHEN (TYLENOL) 500 MG TABLET    Take 1,000 mg by mouth 2 (two) times daily.    AMBULATORY NON FORMULARY MEDICATION    Lorazepam 1mg /ml Gel Sig: Apply 35ml topically every 6 hours as needed for agitation   BISACODYL (DULCOLAX) 10 MG SUPPOSITORY    Place 10 mg rectally as needed for moderate constipation.   DIVALPROEX (DEPAKOTE) 125 MG DR TABLET    Take 125 mg by mouth daily.    ENALAPRIL (VASOTEC) 20 MG TABLET    Take 40 mg by mouth daily.   GLIMEPIRIDE (AMARYL) 1 MG TABLET    Take 1 mg by mouth daily with breakfast.   GLUCAGON HCL, RDNA, (GLUCAGEN IJ)    Inject as directed. 1 mg SQ/IV every 20 KITS minutes as needed for CBG <20 and unable to eat and drink   LEVOTHYROXINE (SYNTHROID, LEVOTHROID) 50 MCG TABLET    Take 50 mcg by mouth daily before breakfast.   LORAZEPAM (ATIVAN) 0.5 MG TABLET    Take one tablet by mouth every 6 hours as needed for agitation/anxiety   METOPROLOL TARTRATE (LOPRESSOR) 25 MG TABLET    Take 25 mg by mouth daily.    OMEPRAZOLE (PRILOSEC) 20 MG CAPSULE    Take 20 mg by mouth daily.   PRESCRIPTION MEDICATION    Apply 1 mL topically every 6 (six) hours as needed (for agitation). Lorazepam 1mg /ml gel   SENNOSIDES-DOCUSATE SODIUM (SENOKOT-S) 8.6-50 MG TABLET    Take 3 tablets by mouth daily.   Modified Medications   No medications on file  Discontinued Medications   No medications on file     Physical Exam: Physical Exam  Constitutional: She is oriented to person, place, and time. She appears well-developed and well-nourished.  HENT:  Head: Normocephalic and atraumatic.  Eyes: Conjunctivae and EOM are normal. Pupils are equal, round, and reactive to light.  Neck: Normal range of motion. Neck supple. No JVD present. No thyromegaly present.  Cardiovascular: Normal rate and regular rhythm.   Murmur heard.  Systolic murmur is present with a grade of 3/6  3/6  Pulmonary/Chest: Effort normal and breath sounds normal. She has no wheezes. She has no  rales.  Abdominal: Soft. Bowel sounds are normal. She exhibits no shifting dullness, no distension and no mass. There is no tenderness. There is no rigidity, no rebound, no guarding, no CVA tenderness and negative Murphy's sign. No hernia.  Previous exam: Multiple external hemorrhoids.   Genitourinary: Vagina normal. No vaginal discharge found.  Previous exam: Sebaceous cysts seen R+L labia majora. No reddened or swelling areas noted.   Musculoskeletal: Normal range of motion. She exhibits no edema or tenderness.  Leaning to her right while in w/c. The right hand grip strength 5/5 but weaker than the left.   Lymphadenopathy:  She has no cervical adenopathy.  Neurological: She is alert and oriented to person, place, and time. She has normal reflexes. No cranial nerve deficit. She exhibits normal muscle tone. Coordination normal.  Skin: No rash noted. No erythema.  R+L mastectomy scars.  Mid to lower back itching rash  Psychiatric: Her mood appears anxious (at times). Her affect is angry, labile and inappropriate. Her speech is not delayed and not slurred. She is agitated and aggressive. She is not actively hallucinating. Thought content is delusional. Thought content is not paranoid. Cognition and memory are impaired. She expresses impulsivity and inappropriate judgment. She exhibits abnormal recent memory and abnormal remote memory.    Filed Vitals:   10/04/14 1341  BP: 160/76  Pulse: 60  Temp: 98.2 F (36.8 C)  TempSrc: Tympanic  Resp: 16   Labs reviewed: Basic Metabolic Panel:  Recent Labs  01/07/14 07/04/14 07/18/14  NA 140 132* 134*  K 4.3 4.1 4.6  BUN 25* 28* 26*  CREATININE 1.2* 1.0 0.8  TSH 1.38 2.55  --    Liver Function Tests:  Recent Labs  10/15/13 01/07/14 07/04/14  AST 13 8* 16  ALT 10 8 13   ALKPHOS 58 54 34   CBC:  Recent Labs  10/15/13 01/07/14 07/04/14  WBC 8.3 7.0 7.5  HGB 14.3 13.5 12.5  HCT 42 40 38  PLT 287 298 300   Lipid Panel: No results  for input(s): CHOL, HDL, LDLCALC, TRIG, CHOLHDL, LDLDIRECT in the last 8760 hours.  Assessment/Plan Type 2 diabetes mellitus with vascular disease 04/15/14 Hgb A1c 5.9 09/10/14 dc CBG per POA   Atrial fibrillation 06/11/14 Metoprolol decreased from 25mg  bid to qam due to refusal.  08/23/14 controlled.  10/04/14 heart rate controlled.     Constipation off MiraLax and Amitiza. Continue DulcoLax suppository q3 days if no BM    Dementia with behavioral disturbance Better with Depakote 125mg  bid since 04/19/13 and able to GDR to 125mg  daily since 8/11/5. Off Zyprexa, Ativan, Amitriptyline.      Essential hypertension Controlled, Metoprolol 25mg , have to change Enalapril 40mg  daily to pm due to mild elevated Bp in am      GERD (gastroesophageal reflux disease) Stable. Continue Omeprazole.     Hyponatremia 07/04/14 Na 132 07/18/14 Na 134     Hypothyroidism TSH 1.378 01/07/14 07/04/14 TSH 2.553 09/10/14 dc Synthroid per POA       Family/ Staff Communication: observe the patient.   Goals of Care: SNF Hospice Service.   Labs/tests ordered: none.

## 2014-10-10 NOTE — Assessment & Plan Note (Signed)
07/04/14 Na 132 07/18/14 Na 134

## 2014-10-10 NOTE — Assessment & Plan Note (Signed)
Better with Depakote 125mg bid since 04/19/13 and able to GDR to 125mg daily since 8/11/5. Off Zyprexa, Ativan, Amitriptyline.    

## 2014-10-10 NOTE — Assessment & Plan Note (Signed)
Stable.  Continue Omeprazole.

## 2014-10-10 NOTE — Assessment & Plan Note (Signed)
04/15/14 Hgb A1c 5.9 09/10/14 dc CBG per POA

## 2014-10-10 NOTE — Assessment & Plan Note (Signed)
06/11/14 Metoprolol decreased from 25mg bid to qam due to refusal.  08/23/14 controlled.  10/04/14 heart rate controlled.     

## 2014-10-10 NOTE — Assessment & Plan Note (Signed)
TSH 1.378 01/07/14 07/04/14 TSH 2.553 09/10/14 dc Synthroid per POA   

## 2014-10-10 NOTE — Assessment & Plan Note (Signed)
Controlled, Metoprolol 25mg, have to change Enalapril 40mg daily to pm due to mild elevated Bp in am      

## 2014-10-10 NOTE — Assessment & Plan Note (Signed)
off MiraLax and Amitiza. Continue DulcoLax suppository q3 days if no BM    

## 2014-11-08 ENCOUNTER — Encounter: Payer: Self-pay | Admitting: Nurse Practitioner

## 2014-11-08 ENCOUNTER — Non-Acute Institutional Stay (SKILLED_NURSING_FACILITY): Payer: Medicare Other | Admitting: Nurse Practitioner

## 2014-11-08 DIAGNOSIS — I482 Chronic atrial fibrillation, unspecified: Secondary | ICD-10-CM

## 2014-11-08 DIAGNOSIS — E871 Hypo-osmolality and hyponatremia: Secondary | ICD-10-CM

## 2014-11-08 DIAGNOSIS — L309 Dermatitis, unspecified: Secondary | ICD-10-CM

## 2014-11-08 DIAGNOSIS — E039 Hypothyroidism, unspecified: Secondary | ICD-10-CM

## 2014-11-08 DIAGNOSIS — F0391 Unspecified dementia with behavioral disturbance: Secondary | ICD-10-CM

## 2014-11-08 DIAGNOSIS — I10 Essential (primary) hypertension: Secondary | ICD-10-CM

## 2014-11-08 DIAGNOSIS — K5900 Constipation, unspecified: Secondary | ICD-10-CM

## 2014-11-08 DIAGNOSIS — E1159 Type 2 diabetes mellitus with other circulatory complications: Secondary | ICD-10-CM

## 2014-11-08 DIAGNOSIS — E1151 Type 2 diabetes mellitus with diabetic peripheral angiopathy without gangrene: Secondary | ICD-10-CM

## 2014-11-08 DIAGNOSIS — F03918 Unspecified dementia, unspecified severity, with other behavioral disturbance: Secondary | ICD-10-CM

## 2014-11-08 DIAGNOSIS — K219 Gastro-esophageal reflux disease without esophagitis: Secondary | ICD-10-CM

## 2014-11-08 NOTE — Assessment & Plan Note (Signed)
07/04/14 Na 132 07/18/14 Na 134 11/08/14 stable clinically.

## 2014-11-08 NOTE — Assessment & Plan Note (Signed)
Better with Depakote 125mg  bid since 04/19/13 and able to GDR to 125mg  daily since 8/11/5. Off Zyprexa, Ativan, Amitriptyline.

## 2014-11-08 NOTE — Assessment & Plan Note (Signed)
06/11/14 Metoprolol decreased from 25mg  bid to qam due to refusal.  08/23/14 controlled.  10/04/14 heart rate controlled.

## 2014-11-08 NOTE — Assessment & Plan Note (Signed)
Controlled, Metoprolol 25mg , have to change Enalapril 40mg  daily to pm due to mild elevated Bp in am

## 2014-11-08 NOTE — Assessment & Plan Note (Signed)
Resolved

## 2014-11-08 NOTE — Assessment & Plan Note (Signed)
04/15/14 Hgb A1c 5.9 09/10/14 dc CBG per POA 11/08/14 comfort measures.

## 2014-11-08 NOTE — Assessment & Plan Note (Signed)
Stable.  Continue Omeprazole.

## 2014-11-08 NOTE — Progress Notes (Signed)
Patient ID: Selena Ward, female   DOB: 1918/10/27, 81 y.o.   MRN: [id number]   Code Status: DNR  Allergies  Allergen Reactions  . Niacin And Related   . Nsaids     Chief Complaint  Patient presents with  . Medical Management of Chronic Issues    HPI: Patient is a 81 y.o. female seen in the SNF at [facility] today for evaluation of chronic medical conditions.  Problem List Items Addressed This Visit    Type 2 diabetes mellitus with vascular disease - Primary    04/15/14 Hgb A1c 5.9 09/10/14 dc CBG per POA 11/08/14 comfort measures.       Hypothyroidism    TSH 1.378 01/07/14 07/04/14 TSH 2.553 09/10/14 dc Synthroid per POA      Hyponatremia    07/04/14 Na 132 07/18/14 Na 134 11/08/14 stable clinically.      GERD (gastroesophageal reflux disease)    Stable. Continue Omeprazole.       Essential hypertension    Controlled, Metoprolol 25mg , have to change Enalapril 40mg  daily to pm due to mild elevated Bp in am         Dermatitis    Resolved.     Dementia with behavioral disturbance    Better with Depakote 125mg  bid since 04/19/13 and able to GDR to 125mg  daily since 8/11/5. Off Zyprexa, Ativan, Amitriptyline.       Constipation    off MiraLax and Amitiza. Continue DulcoLax suppository q3 days if no BM      Atrial fibrillation    06/11/14 Metoprolol decreased from 25mg  bid to qam due to refusal.  08/23/14 controlled.  10/04/14 heart rate controlled.           Review of Systems:  Review of Systems  Constitutional: Negative for fever, chills, weight loss, malaise/fatigue and diaphoresis.  HENT: Positive for hearing loss. Negative for congestion, ear pain and sore throat.   Eyes: Negative for pain, discharge and redness.  Respiratory: Negative for cough, shortness of breath and wheezing.   Cardiovascular: Negative for chest pain, palpitations, orthopnea, claudication, leg swelling and PND.  Gastrointestinal: Negative for heartburn, nausea, vomiting,  abdominal pain, diarrhea, constipation and blood in stool.       Numerous sebaceous cysts R+L labia majora.   Genitourinary: Negative for dysuria, urgency, hematuria and flank pain. Frequency: incontinent of bladder.       Staff reported reddened and swelling in vaginal areas. Incontinent of B+B  Musculoskeletal: Positive for falls. Negative for myalgias, back pain, joint pain and neck pain.       Leaning to her right with RUE weaker than the left.  [place] 05/29/14. 07/02/14  Skin: Positive for itching. Negative for rash.       R+L mastectomies.    Neurological: Negative for dizziness, sensory change, speech change, focal weakness, seizures, loss of consciousness, weakness and headaches.       Resolved an episode of unresponsiveness.   Endo/Heme/Allergies: Negative for environmental allergies and polydipsia. Does not bruise/bleed easily.  Psychiatric/Behavioral: Positive for memory loss. Negative for depression and hallucinations. The patient is not nervous/anxious and does not have insomnia.        Cursing, hitting, kicking, combative, angry, refusal of food/meds-no longer problematic.      Past Medical History  Diagnosis Date  . Depression   . Heart murmur   . Hypertension   . Diabetes mellitus without complication   . CHF (congestive heart failure)   . Anxiety   .  GERD (gastroesophageal reflux disease)   . Bilateral breast cancer 2004  . Degeneration of intervertebral disc, site unspecified   . Lumbago   . Disorder of bone and cartilage, unspecified   . Debility, unspecified   . Senile dementia with depressive features   . Unspecified hypothyroidism   . Other and unspecified hyperlipidemia   . Atrial fibrillation   . Unspecified constipation   . Hydronephrosis   . Senile osteoporosis   . Memory loss   . Closed fracture of anatomical neck of humerus   . Hypopotassemia   . Hydronephrosis    Medications: Patient's Medications  New Prescriptions   No medications on file    Previous Medications   ACETAMINOPHEN (TYLENOL) 500 MG TABLET    Take 1,000 mg by mouth 2 (two) times daily.    AMBULATORY NON FORMULARY MEDICATION    Lorazepam 1mg /ml Gel Sig: Apply 78ml topically every 6 hours as needed for agitation   BISACODYL (DULCOLAX) 10 MG SUPPOSITORY    Place 10 mg rectally as needed for moderate constipation.   DIVALPROEX (DEPAKOTE) 125 MG DR TABLET    Take 125 mg by mouth daily.    ENALAPRIL (VASOTEC) 20 MG TABLET    Take 40 mg by mouth daily.   GLIMEPIRIDE (AMARYL) 1 MG TABLET    Take 1 mg by mouth daily with breakfast.   GLUCAGON HCL, RDNA, (GLUCAGEN IJ)    Inject as directed. 1 mg SQ/IV every 20 KITS minutes as needed for CBG <20 and unable to eat and drink   LEVOTHYROXINE (SYNTHROID, LEVOTHROID) 50 MCG TABLET    Take 50 mcg by mouth daily before breakfast.   LORAZEPAM (ATIVAN) 0.5 MG TABLET    Take one tablet by mouth every 6 hours as needed for agitation/anxiety   METOPROLOL TARTRATE (LOPRESSOR) 25 MG TABLET    Take 25 mg by mouth daily.    OMEPRAZOLE (PRILOSEC) 20 MG CAPSULE    Take 20 mg by mouth daily.   PRESCRIPTION MEDICATION    Apply 1 mL topically every 6 (six) hours as needed (for agitation). Lorazepam 1mg /ml gel   SENNOSIDES-DOCUSATE SODIUM (SENOKOT-S) 8.6-50 MG TABLET    Take 3 tablets by mouth daily.   Modified Medications   No medications on file  Discontinued Medications   No medications on file     Physical Exam: Physical Exam  Constitutional: She is oriented to person, place, and time. She appears well-developed and well-nourished.  HENT:  Head: Normocephalic and atraumatic.  Eyes: Conjunctivae and EOM are normal. Pupils are equal, round, and reactive to light.  Neck: Normal range of motion. Neck supple. No JVD present. No thyromegaly present.  Cardiovascular: Normal rate and regular rhythm.   Murmur heard.  Systolic murmur is present with a grade of 3/6  3/6  Pulmonary/Chest: Effort normal and breath sounds normal. She has no wheezes.  She has no rales.  Abdominal: Soft. Bowel sounds are normal. She exhibits no shifting dullness, no distension and no mass. There is no tenderness. There is no rigidity, no rebound, no guarding, no CVA tenderness and negative Murphy's sign. No hernia.  Previous exam: Multiple external hemorrhoids.   Genitourinary: Vagina normal. No vaginal discharge found.  Previous exam: Sebaceous cysts seen R+L labia majora. No reddened or swelling areas noted.   Musculoskeletal: Normal range of motion. She exhibits no edema or tenderness.  Leaning to her right while in w/c. The right hand grip strength 5/5 but weaker than the left.   Lymphadenopathy:  She has no cervical adenopathy.  Neurological: She is alert and oriented to person, place, and time. She has normal reflexes. No cranial nerve deficit. She exhibits normal muscle tone. Coordination normal.  Skin: No rash noted. No erythema.  R+L mastectomy scars.  Mid to lower back itching rash  Psychiatric: Her mood appears anxious (at times). Her affect is angry, labile and inappropriate. Her speech is not delayed and not slurred. She is agitated and aggressive. She is not actively hallucinating. Thought content is delusional. Thought content is not paranoid. Cognition and memory are impaired. She expresses impulsivity and inappropriate judgment. She exhibits abnormal recent memory and abnormal remote memory.    Filed Vitals:   11/08/14 1402  BP: 142/80  Pulse: 84  Temp: 98.2 F (36.8 C)  TempSrc: Tympanic  Resp: 20   Labs reviewed: Basic Metabolic Panel:  Recent Labs  01/07/14 07/04/14 07/18/14  NA 140 132* 134*  K 4.3 4.1 4.6  BUN 25* 28* 26*  CREATININE 1.2* 1.0 0.8  TSH 1.38 2.55  --    Liver Function Tests:  Recent Labs  01/07/14 07/04/14  AST 8* 16  ALT 8 13  ALKPHOS 54 34   CBC:  Recent Labs  01/07/14 07/04/14  WBC 7.0 7.5  HGB 13.5 12.5  HCT 40 38  PLT 298 300   Lipid Panel: No results for input(s): CHOL, HDL, LDLCALC,  TRIG, CHOLHDL, LDLDIRECT in the last 8760 hours.  Assessment/Plan Type 2 diabetes mellitus with vascular disease 04/15/14 Hgb A1c 5.9 09/10/14 dc CBG per POA 11/08/14 comfort measures.     Hypothyroidism TSH 1.378 01/07/14 07/04/14 TSH 2.553 09/10/14 dc Synthroid per POA    Hyponatremia 07/04/14 Na 132 07/18/14 Na 134 11/08/14 stable clinically.    GERD (gastroesophageal reflux disease) Stable. Continue Omeprazole.     Essential hypertension Controlled, Metoprolol 25mg , have to change Enalapril 40mg  daily to pm due to mild elevated Bp in am       Dermatitis Resolved.   Dementia with behavioral disturbance Better with Depakote 125mg  bid since 04/19/13 and able to GDR to 125mg  daily since 8/11/5. Off Zyprexa, Ativan, Amitriptyline.     Constipation off MiraLax and Amitiza. Continue DulcoLax suppository q3 days if no BM    Atrial fibrillation 06/11/14 Metoprolol decreased from 25mg  bid to qam due to refusal.  08/23/14 controlled.  10/04/14 heart rate controlled.        Family/ Staff Communication: observe the patient.   Goals of Care: SNF Hospice Service.   Labs/tests ordered: none.

## 2014-11-08 NOTE — Assessment & Plan Note (Signed)
TSH 1.378 01/07/14 07/04/14 TSH 2.553 09/10/14 dc Synthroid per POA

## 2014-11-08 NOTE — Assessment & Plan Note (Signed)
off MiraLax and Amitiza. Continue DulcoLax suppository q3 days if no BM

## 2014-11-15 ENCOUNTER — Non-Acute Institutional Stay (SKILLED_NURSING_FACILITY): Payer: Medicare Other | Admitting: Nurse Practitioner

## 2014-11-15 DIAGNOSIS — F03918 Unspecified dementia, unspecified severity, with other behavioral disturbance: Secondary | ICD-10-CM

## 2014-11-15 DIAGNOSIS — I10 Essential (primary) hypertension: Secondary | ICD-10-CM

## 2014-11-15 DIAGNOSIS — R413 Other amnesia: Secondary | ICD-10-CM

## 2014-11-15 DIAGNOSIS — E1159 Type 2 diabetes mellitus with other circulatory complications: Secondary | ICD-10-CM

## 2014-11-15 DIAGNOSIS — F0391 Unspecified dementia with behavioral disturbance: Secondary | ICD-10-CM

## 2014-11-15 DIAGNOSIS — I482 Chronic atrial fibrillation, unspecified: Secondary | ICD-10-CM

## 2014-11-15 DIAGNOSIS — K219 Gastro-esophageal reflux disease without esophagitis: Secondary | ICD-10-CM

## 2014-11-15 DIAGNOSIS — E039 Hypothyroidism, unspecified: Secondary | ICD-10-CM

## 2014-11-15 DIAGNOSIS — K5900 Constipation, unspecified: Secondary | ICD-10-CM

## 2014-11-15 DIAGNOSIS — N390 Urinary tract infection, site not specified: Secondary | ICD-10-CM

## 2014-11-15 DIAGNOSIS — E1151 Type 2 diabetes mellitus with diabetic peripheral angiopathy without gangrene: Secondary | ICD-10-CM

## 2014-11-15 DIAGNOSIS — R600 Localized edema: Secondary | ICD-10-CM

## 2014-11-15 NOTE — Assessment & Plan Note (Signed)
Better with Depakote 125mg  bid since 04/19/13 and able to GDR to 125mg  daily since 8/11/5. Off Zyprexa, Ativan, Amitriptyline.

## 2014-11-15 NOTE — Assessment & Plan Note (Signed)
06/11/14 Metoprolol decreased from 25mg  bid to qam due to refusal.  08/23/14 controlled.  10/04/14 heart rate controlled.  11/15/14 controlled.

## 2014-11-15 NOTE — Assessment & Plan Note (Signed)
TSH 1.378 01/07/14 07/04/14 TSH 2.553 09/10/14 dc Synthroid per POA

## 2014-11-15 NOTE — Assessment & Plan Note (Signed)
Trace, mainly in the left foot, denied pain, no noted erythema or overt swelling. Continue Hospice service to ensure comfort measures.

## 2014-11-15 NOTE — Assessment & Plan Note (Signed)
off MiraLax and Amitiza. Continue DulcoLax suppository q3 days if no BM

## 2014-11-15 NOTE — Assessment & Plan Note (Signed)
Stable.  Continue Omeprazole.

## 2014-11-15 NOTE — Assessment & Plan Note (Addendum)
04/15/14 Hgb A1c 5.9 09/10/14 dc CBG per POA 11/08/14 comfort measures.  11/15/14 off meds.

## 2014-11-15 NOTE — Progress Notes (Signed)
Patient ID: Selena Ward, female   DOB: [birth year], 81 y.o.   MRN: [id number]   Code Status: DNR  Allergies  Allergen Reactions  . Niacin And Related   . Nsaids     Chief Complaint  Patient presents with  . Medical Management of Chronic Issues  . Acute Visit    abd pain, LLE edema, anxiety    HPI: Patient is a 81 y.o. female seen in the SNF at [facility] today for evaluation of LLE edema, abd pain, anxiety, and chronic medical conditions.  Problem List Items Addressed This Visit    UTI (urinary tract infection)    C/o lower abd pain-not reliable-the patient is noted to be anxious than usual. Will obtain UA C/S. Continue to monitor the patient.       Type 2 diabetes mellitus with vascular disease - Primary    04/15/14 Hgb A1c 5.9 09/10/14 dc CBG per POA 11/08/14 comfort measures.  11/15/14 off meds.         Memory loss    Better with Depakote 125mg  bid since 04/19/13 and able to GDR to 125mg  daily since 8/11/5. Off Zyprexa, Ativan, Amitriptyline.  11/15/14 stable        Leg edema, left    Trace, mainly in the left foot, denied pain, no noted erythema or overt swelling. Continue Hospice service to ensure comfort measures.       Hypothyroidism    TSH 1.378 01/07/14 07/04/14 TSH 2.553 09/10/14 dc Synthroid per POA        GERD (gastroesophageal reflux disease)    Stable. Continue Omeprazole.        Essential hypertension    Controlled, takes Metoprolol 25mg  and  Enalapril 40mg  daily         Dementia with behavioral disturbance    Better with Depakote 125mg  bid since 04/19/13 and able to GDR to 125mg  daily since 8/11/5. Off Zyprexa, Ativan, Amitriptyline.         Constipation    off MiraLax and Amitiza. Continue DulcoLax suppository q3 days if no BM         Atrial fibrillation    06/11/14 Metoprolol decreased from 25mg  bid to qam due to refusal.  08/23/14 controlled.  10/04/14 heart rate controlled.  11/15/14 controlled.            Review of  Systems:  Review of Systems  Constitutional: Negative for fever, chills, weight loss, malaise/fatigue and diaphoresis.  HENT: Positive for hearing loss. Negative for congestion, ear discharge, ear pain, nosebleeds, sore throat and tinnitus.   Eyes: Negative for blurred vision, double vision, photophobia, pain, discharge and redness.  Respiratory: Negative for cough, hemoptysis, sputum production, shortness of breath, wheezing and stridor.   Cardiovascular: Positive for leg swelling. Negative for chest pain, palpitations, orthopnea, claudication and PND.       LLE trace  Gastrointestinal: Positive for abdominal pain. Negative for heartburn, nausea, vomiting, diarrhea, constipation, blood in stool and melena.       Suprapubic discomfort.   Genitourinary: Positive for frequency. Negative for dysuria, urgency, hematuria and flank pain.       Incontinent B+B  Musculoskeletal: Negative for myalgias, back pain, joint pain, falls and neck pain.  Skin: Negative for itching and rash.  Neurological: Positive for focal weakness. Negative for dizziness, tingling, tremors, sensory change, speech change, seizures, loss of consciousness, weakness and headaches.       RUE weakness  Endo/Heme/Allergies: Negative for environmental allergies and polydipsia. Does not bruise/bleed  easily.  Psychiatric/Behavioral: Positive for memory loss. Negative for depression, suicidal ideas, hallucinations and substance abuse. The patient is nervous/anxious. The patient does not have insomnia.      Past Medical History  Diagnosis Date  . Depression   . Heart murmur   . Hypertension   . Diabetes mellitus without complication   . CHF (congestive heart failure)   . Anxiety   . GERD (gastroesophageal reflux disease)   . Bilateral breast cancer 2004  . Degeneration of intervertebral disc, site unspecified   . Lumbago   . Disorder of bone and cartilage, unspecified   . Debility, unspecified   . Senile dementia with  depressive features   . Unspecified hypothyroidism   . Other and unspecified hyperlipidemia   . Atrial fibrillation   . Unspecified constipation   . Hydronephrosis   . Senile osteoporosis   . Memory loss   . Closed fracture of anatomical neck of humerus   . Hypopotassemia   . Hydronephrosis    Medications: Patient's Medications  New Prescriptions   No medications on file  Previous Medications   ACETAMINOPHEN (TYLENOL) 500 MG TABLET    Take 1,000 mg by mouth 2 (two) times daily.    AMBULATORY NON FORMULARY MEDICATION    Lorazepam 1mg /ml Gel Sig: Apply 17ml topically every 6 hours as needed for agitation   BISACODYL (DULCOLAX) 10 MG SUPPOSITORY    Place 10 mg rectally as needed for moderate constipation.   DIVALPROEX (DEPAKOTE) 125 MG DR TABLET    Take 125 mg by mouth daily.    ENALAPRIL (VASOTEC) 20 MG TABLET    Take 40 mg by mouth daily.   LORAZEPAM (ATIVAN) 0.5 MG TABLET    Take one tablet by mouth every 6 hours as needed for agitation/anxiety   METOPROLOL TARTRATE (LOPRESSOR) 25 MG TABLET    Take 25 mg by mouth daily.    OMEPRAZOLE (PRILOSEC) 20 MG CAPSULE    Take 20 mg by mouth daily.   PRESCRIPTION MEDICATION    Apply 1 mL topically every 6 (six) hours as needed (for agitation). Lorazepam 1mg /ml gel   SENNOSIDES-DOCUSATE SODIUM (SENOKOT-S) 8.6-50 MG TABLET    Take 3 tablets by mouth daily.   Modified Medications   No medications on file  Discontinued Medications   GLIMEPIRIDE (AMARYL) 1 MG TABLET    Take 1 mg by mouth daily with breakfast.   GLUCAGON HCL, RDNA, (GLUCAGEN IJ)    Inject as directed. 1 mg SQ/IV every 20 KITS minutes as needed for CBG <20 and unable to eat and drink   LEVOTHYROXINE (SYNTHROID, LEVOTHROID) 50 MCG TABLET    Take 50 mcg by mouth daily before breakfast.     Physical Exam: Physical Exam  Constitutional: She is oriented to person, place, and time. She appears well-developed and well-nourished.  HENT:  Head: Normocephalic and atraumatic.  Eyes:  Conjunctivae and EOM are normal. Pupils are equal, round, and reactive to light.  Neck: Normal range of motion. Neck supple. No JVD present. No thyromegaly present.  Cardiovascular: Normal rate and regular rhythm.   Murmur heard.  Systolic murmur is present with a grade of 3/6  3/6  Pulmonary/Chest: Effort normal and breath sounds normal. She has no wheezes. She has no rales.  Abdominal: Soft. Bowel sounds are normal. She exhibits no shifting dullness, no distension and no mass. There is no tenderness. There is no rigidity, no rebound, no guarding, no CVA tenderness and negative Murphy's sign. No hernia.  Previous exam:  Multiple external hemorrhoids.   Genitourinary: Vagina normal. No vaginal discharge found.  Previous exam: Sebaceous cysts seen R+L labia majora. No reddened or swelling areas noted.   Musculoskeletal: Normal range of motion. She exhibits edema. She exhibits no tenderness.  Leaning to her right while in w/c. The right hand grip strength 5/5 but weaker than the left.  LLE edema trace  Lymphadenopathy:    She has no cervical adenopathy.  Neurological: She is alert and oriented to person, place, and time. She has normal reflexes. No cranial nerve deficit. She exhibits normal muscle tone. Coordination normal.  Skin: No rash noted. No erythema.  R+L mastectomy scars.  Mid to lower back itching rash  Psychiatric: Her mood appears anxious (at times). Her affect is angry, labile and inappropriate. Her speech is not delayed and not slurred. She is agitated and aggressive. She is not actively hallucinating. Thought content is delusional. Thought content is not paranoid. Cognition and memory are impaired. She expresses impulsivity and inappropriate judgment. She exhibits abnormal recent memory and abnormal remote memory.    Filed Vitals:   11/15/14 1112  BP: 150/80  Pulse: 78  Temp: 97 F (36.1 C)  TempSrc: Tympanic  Resp: 16   Labs reviewed: Basic Metabolic Panel:  Recent  Labs  01/07/14 07/04/14 07/18/14  NA 140 132* 134*  K 4.3 4.1 4.6  BUN 25* 28* 26*  CREATININE 1.2* 1.0 0.8  TSH 1.38 2.55  --    Liver Function Tests:  Recent Labs  01/07/14 07/04/14  AST 8* 16  ALT 8 13  ALKPHOS 54 34   CBC:  Recent Labs  01/07/14 07/04/14  WBC 7.0 7.5  HGB 13.5 12.5  HCT 40 38  PLT 298 300   Lipid Panel: No results for input(s): CHOL, HDL, LDLCALC, TRIG, CHOLHDL, LDLDIRECT in the last 8760 hours.  Assessment/Plan Type 2 diabetes mellitus with vascular disease 04/15/14 Hgb A1c 5.9 09/10/14 dc CBG per POA 11/08/14 comfort measures.  11/15/14 off meds.      UTI (urinary tract infection) C/o lower abd pain-not reliable-the patient is noted to be anxious than usual. Will obtain UA C/S. Continue to monitor the patient.    Memory loss Better with Depakote 125mg  bid since 04/19/13 and able to GDR to 125mg  daily since 8/11/5. Off Zyprexa, Ativan, Amitriptyline.  11/15/14 stable     Hypothyroidism TSH 1.378 01/07/14 07/04/14 TSH 2.553 09/10/14 dc Synthroid per POA     GERD (gastroesophageal reflux disease) Stable. Continue Omeprazole.     Essential hypertension Controlled, takes Metoprolol 25mg  and  Enalapril 40mg  daily      Dementia with behavioral disturbance Better with Depakote 125mg  bid since 04/19/13 and able to GDR to 125mg  daily since 8/11/5. Off Zyprexa, Ativan, Amitriptyline.      Constipation off MiraLax and Amitiza. Continue DulcoLax suppository q3 days if no BM      Atrial fibrillation 06/11/14 Metoprolol decreased from 25mg  bid to qam due to refusal.  08/23/14 controlled.  10/04/14 heart rate controlled.  11/15/14 controlled.      Leg edema, left Trace, mainly in the left foot, denied pain, no noted erythema or overt swelling. Continue Hospice service to ensure comfort measures.      Family/ Staff Communication: observe the patient.   Goals of Care: SNF Hospice Service.   Labs/tests ordered: UA C/S

## 2014-11-15 NOTE — Assessment & Plan Note (Signed)
Better with Depakote 125mg  bid since 04/19/13 and able to GDR to 125mg  daily since 8/11/5. Off Zyprexa, Ativan, Amitriptyline.  11/15/14 stable

## 2014-11-15 NOTE — Assessment & Plan Note (Signed)
C/o lower abd pain-not reliable-the patient is noted to be anxious than usual. Will obtain UA C/S. Continue to monitor the patient.

## 2014-11-15 NOTE — Assessment & Plan Note (Signed)
Controlled, takes Metoprolol 25mg  and  Enalapril 40mg  daily

## 2014-12-24 ENCOUNTER — Non-Acute Institutional Stay (SKILLED_NURSING_FACILITY): Payer: Medicare Other | Admitting: Nurse Practitioner

## 2014-12-24 DIAGNOSIS — K219 Gastro-esophageal reflux disease without esophagitis: Secondary | ICD-10-CM

## 2014-12-24 DIAGNOSIS — K5900 Constipation, unspecified: Secondary | ICD-10-CM | POA: Diagnosis not present

## 2014-12-24 DIAGNOSIS — R609 Edema, unspecified: Secondary | ICD-10-CM | POA: Diagnosis not present

## 2014-12-24 DIAGNOSIS — R413 Other amnesia: Secondary | ICD-10-CM

## 2014-12-24 DIAGNOSIS — I10 Essential (primary) hypertension: Secondary | ICD-10-CM

## 2014-12-24 DIAGNOSIS — I482 Chronic atrial fibrillation, unspecified: Secondary | ICD-10-CM

## 2014-12-24 NOTE — Progress Notes (Signed)
Patient ID: Selena Ward, female   DOB: 17-Apr-1918, 81 y.o.   MRN: [id number]   Code Status: DNR  Allergies  Allergen Reactions  . Niacin And Related   . Nsaids     Chief Complaint  Patient presents with  . Medical Management of Chronic Issues  . Acute Visit    edema    HPI: Patient is a 81 y.o. female seen in the SNF at [facility] today for evaluation of dependent edema and chronic medical conditions.  Problem List Items Addressed This Visit    Memory loss    Better with Depakote 125mg  bid since 04/19/13 and able to GDR to 125mg  daily since 8/11/5. Off Zyprexa, Ativan, Amitriptyline.  --stable        GERD (gastroesophageal reflux disease)    Stable. Continue Omeprazole.        Essential hypertension    Controlled, takes Metoprolol 25mg  and  Enalapril 40mg  daily        Edema - Primary    Dependent edema seen-no SOB, orthopnea, sputum production noted, her goal of care is comfort measures, will continue to observe the patient, may consider diuretics if respiratory distress develops.       Constipation    off MiraLax and Amitiza. Continue DulcoLax suppository q3 days if no BM       Atrial fibrillation    Heart rate is in control, continue Metoprolol          Review of Systems:  Review of Systems  Constitutional: Positive for malaise/fatigue. Negative for fever, chills, weight loss and diaphoresis.  HENT: Positive for hearing loss. Negative for congestion, ear discharge, ear pain, nosebleeds, sore throat and tinnitus.   Eyes: Negative for blurred vision, double vision, photophobia, pain, discharge and redness.  Respiratory: Negative for cough, hemoptysis, sputum production, shortness of breath, wheezing and stridor.   Cardiovascular: Positive for leg swelling. Negative for chest pain, palpitations, orthopnea, claudication and PND.  Gastrointestinal: Negative for heartburn, nausea, vomiting, abdominal pain, diarrhea, constipation, blood in stool and  melena.  Genitourinary: Positive for frequency. Negative for dysuria, urgency, hematuria and flank pain.  Musculoskeletal: Positive for joint pain. Negative for myalgias, back pain, falls and neck pain.  Skin: Negative for itching and rash.  Neurological: Positive for focal weakness and weakness. Negative for dizziness, tingling, tremors, sensory change, speech change, seizures, loss of consciousness and headaches.  Endo/Heme/Allergies: Negative for environmental allergies and polydipsia. Does not bruise/bleed easily.  Psychiatric/Behavioral: Positive for memory loss. Negative for depression, suicidal ideas, hallucinations and substance abuse. The patient is nervous/anxious. The patient does not have insomnia.      Past Medical History  Diagnosis Date  . Depression   . Heart murmur   . Hypertension   . Diabetes mellitus without complication   . CHF (congestive heart failure)   . Anxiety   . GERD (gastroesophageal reflux disease)   . Bilateral breast cancer 2004  . Degeneration of intervertebral disc, site unspecified   . Lumbago   . Disorder of bone and cartilage, unspecified   . Debility, unspecified   . Senile dementia with depressive features   . Unspecified hypothyroidism   . Other and unspecified hyperlipidemia   . Atrial fibrillation   . Unspecified constipation   . Hydronephrosis   . Senile osteoporosis   . Memory loss   . Closed fracture of anatomical neck of humerus   . Hypopotassemia   . Hydronephrosis    Medications: Patient's Medications  New Prescriptions  No medications on file  Previous Medications   ACETAMINOPHEN (TYLENOL) 500 MG TABLET    Take 1,000 mg by mouth 2 (two) times daily.    AMBULATORY NON FORMULARY MEDICATION    Lorazepam 1mg /ml Gel Sig: Apply 57ml topically every 6 hours as needed for agitation   BISACODYL (DULCOLAX) 10 MG SUPPOSITORY    Place 10 mg rectally as needed for moderate constipation.   DIVALPROEX (DEPAKOTE) 125 MG DR TABLET    Take  125 mg by mouth daily.    ENALAPRIL (VASOTEC) 20 MG TABLET    Take 40 mg by mouth daily.   LORAZEPAM (ATIVAN) 0.5 MG TABLET    Take one tablet by mouth every 6 hours as needed for agitation/anxiety   METOPROLOL TARTRATE (LOPRESSOR) 25 MG TABLET    Take 25 mg by mouth daily.    OMEPRAZOLE (PRILOSEC) 20 MG CAPSULE    Take 20 mg by mouth daily.   PRESCRIPTION MEDICATION    Apply 1 mL topically every 6 (six) hours as needed (for agitation). Lorazepam 1mg /ml gel   SENNOSIDES-DOCUSATE SODIUM (SENOKOT-S) 8.6-50 MG TABLET    Take 3 tablets by mouth daily.   Modified Medications   No medications on file  Discontinued Medications   No medications on file     Physical Exam: Physical Exam  Constitutional: She is oriented to person, place, and time. She appears well-developed and well-nourished.  HENT:  Head: Normocephalic and atraumatic.  Eyes: Conjunctivae and EOM are normal. Pupils are equal, round, and reactive to light.  Neck: Normal range of motion. Neck supple. No JVD present. No thyromegaly present.  Cardiovascular: Normal rate and regular rhythm.   Murmur heard.  Systolic murmur is present with a grade of 3/6  3/6  Pulmonary/Chest: Effort normal and breath sounds normal. She has no wheezes. She has no rales.  Abdominal: Soft. Bowel sounds are normal. She exhibits no shifting dullness, no distension and no mass. There is no tenderness. There is no rigidity, no rebound, no guarding, no CVA tenderness and negative Murphy's sign. No hernia.  Previous exam: Multiple external hemorrhoids.   Genitourinary: Vagina normal. No vaginal discharge found.  Previous exam: Sebaceous cysts seen R+L labia majora. No reddened or swelling areas noted.   Musculoskeletal: Normal range of motion. She exhibits edema. She exhibits no tenderness.  Leaning to her right while in w/c. The right hand grip strength 5/5 but weaker than the left. Dependent edema trace to 1+  Lymphadenopathy:    She has no cervical  adenopathy.  Neurological: She is alert and oriented to person, place, and time. She has normal reflexes. No cranial nerve deficit. She exhibits normal muscle tone. Coordination normal.  Skin: No rash noted. No erythema.  R+L mastectomy scars.  Mid to lower back itching rash  Psychiatric: Her mood appears anxious (at times). Her affect is angry, labile and inappropriate. Her speech is not delayed and not slurred. She is agitated and aggressive. She is not actively hallucinating. Thought content is delusional. Thought content is not paranoid. Cognition and memory are impaired. She expresses impulsivity and inappropriate judgment. She exhibits abnormal recent memory and abnormal remote memory.    Filed Vitals:   12/24/14 1433  BP: 102/78  Pulse: 80  Temp: 98.2 F (36.8 C)  TempSrc: Tympanic  Resp: 18   Labs reviewed: Basic Metabolic Panel:  Recent Labs  01/07/14 07/04/14 07/18/14  NA 140 132* 134*  K 4.3 4.1 4.6  BUN 25* 28* 26*  CREATININE 1.2* 1.0  0.8  TSH 1.38 2.55  --    Liver Function Tests:  Recent Labs  01/07/14 07/04/14  AST 8* 16  ALT 8 13  ALKPHOS 54 34   CBC:  Recent Labs  01/07/14 07/04/14  WBC 7.0 7.5  HGB 13.5 12.5  HCT 40 38  PLT 298 300   Lipid Panel: No results for input(s): CHOL, HDL, LDLCALC, TRIG, CHOLHDL, LDLDIRECT in the last 8760 hours.  Assessment/Plan Edema Dependent edema seen-no SOB, orthopnea, sputum production noted, her goal of care is comfort measures, will continue to observe the patient, may consider diuretics if respiratory distress develops.    GERD (gastroesophageal reflux disease) Stable. Continue Omeprazole.     Memory loss Better with Depakote 125mg  bid since 04/19/13 and able to GDR to 125mg  daily since 8/11/5. Off Zyprexa, Ativan, Amitriptyline.  --stable     Constipation off MiraLax and Amitiza. Continue DulcoLax suppository q3 days if no BM    Atrial fibrillation Heart rate is in control, continue  Metoprolol    Essential hypertension Controlled, takes Metoprolol 25mg  and  Enalapril 40mg  daily       Family/ Staff Communication: observe the patient.   Goals of Care: SNF Hospice Service.   Labs/tests ordered: none

## 2014-12-24 NOTE — Assessment & Plan Note (Signed)
Controlled, takes Metoprolol 25mg  and  Enalapril 40mg  daily

## 2014-12-24 NOTE — Assessment & Plan Note (Signed)
Heart rate is in control, continue Metoprolol  

## 2014-12-24 NOTE — Assessment & Plan Note (Signed)
Stable.  Continue Omeprazole.

## 2014-12-24 NOTE — Assessment & Plan Note (Signed)
Dependent edema seen-no SOB, orthopnea, sputum production noted, her goal of care is comfort measures, will continue to observe the patient, may consider diuretics if respiratory distress develops.

## 2014-12-24 NOTE — Assessment & Plan Note (Signed)
Better with Depakote 125mg  bid since 04/19/13 and able to GDR to 125mg  daily since 8/11/5. Off Zyprexa, Ativan, Amitriptyline.  --stable

## 2014-12-24 NOTE — Assessment & Plan Note (Signed)
off MiraLax and Amitiza. Continue DulcoLax suppository q3 days if no BM

## 2014-12-26 ENCOUNTER — Non-Acute Institutional Stay (SKILLED_NURSING_FACILITY): Payer: Medicare Other | Admitting: Internal Medicine

## 2014-12-26 DIAGNOSIS — F0391 Unspecified dementia with behavioral disturbance: Secondary | ICD-10-CM | POA: Diagnosis not present

## 2014-12-26 DIAGNOSIS — I10 Essential (primary) hypertension: Secondary | ICD-10-CM | POA: Diagnosis not present

## 2014-12-26 DIAGNOSIS — E1151 Type 2 diabetes mellitus with diabetic peripheral angiopathy without gangrene: Secondary | ICD-10-CM | POA: Diagnosis not present

## 2014-12-26 DIAGNOSIS — E1159 Type 2 diabetes mellitus with other circulatory complications: Secondary | ICD-10-CM

## 2014-12-26 DIAGNOSIS — F03918 Unspecified dementia, unspecified severity, with other behavioral disturbance: Secondary | ICD-10-CM

## 2014-12-26 DIAGNOSIS — E039 Hypothyroidism, unspecified: Secondary | ICD-10-CM

## 2014-12-26 DIAGNOSIS — R627 Adult failure to thrive: Secondary | ICD-10-CM

## 2014-12-26 DIAGNOSIS — N390 Urinary tract infection, site not specified: Secondary | ICD-10-CM | POA: Diagnosis not present

## 2014-12-26 NOTE — Progress Notes (Signed)
Patient ID: Selena Ward, female   DOB: 1917/11/22, 81 y.o.   MRN: [id number]    [facility] Room Number: N30  Place of Service: SNF (31) OFFICE   Allergies  Allergen Reactions  . Niacin And Related   . Nsaids     Chief Complaint  Patient presents with  . Medical Management of Chronic Issues    HPI:  Dementia with behavioral disturbance significant dementia. Recent behaviors have been acceptable and not excessively disturbed.  Essential hypertension: Controlled  FTT (failure to thrive) in adult: Poor caloric intake. Has lost a significant amount of weight over the last year. Current weight is 104 pounds. One year ago it was 118.3 pounds in January 2013 weight was 150.5 pounds  Hypothyroidism, unspecified hypothyroidism type: Compensated  Type 2 diabetes mellitus with vascular disease: Stable  Urinary tract infection without hematuria, site unspecified: Last infection 12/06/14 with greater than 100,000 gram-negative rods. Treated with Cipro and Florastor for 7 days.    Medications: Patient's Medications  New Prescriptions   No medications on file  Previous Medications   ACETAMINOPHEN (TYLENOL) 500 MG TABLET    Take 1,000 mg by mouth 2 (two) times daily.    AMBULATORY NON FORMULARY MEDICATION    Lorazepam 1mg /ml Gel Sig: Apply 18ml topically every 6 hours as needed for agitation   BISACODYL (DULCOLAX) 10 MG SUPPOSITORY    Place 10 mg rectally as needed for moderate constipation.   DIVALPROEX (DEPAKOTE) 125 MG DR TABLET    Take 125 mg by mouth daily.    ENALAPRIL (VASOTEC) 20 MG TABLET    Take 40 mg by mouth daily.   LORAZEPAM (ATIVAN) 0.5 MG TABLET    Take one tablet by mouth every 6 hours as needed for agitation/anxiety   METOPROLOL TARTRATE (LOPRESSOR) 25 MG TABLET    Take 25 mg by mouth daily.    OMEPRAZOLE (PRILOSEC) 20 MG CAPSULE    Take 20 mg by mouth daily.   PRESCRIPTION MEDICATION    Apply 1 mL topically every 6 (six) hours as  needed (for agitation). Lorazepam 1mg /ml gel   SENNOSIDES-DOCUSATE SODIUM (SENOKOT-S) 8.6-50 MG TABLET    Take 3 tablets by mouth daily.   Modified Medications   No medications on file  Discontinued Medications   No medications on file     Review of Systems  Constitutional: Negative for fever, chills, diaphoresis, activity change, appetite change, fatigue and unexpected weight change.  HENT: Positive for hearing loss (Severe hearing loss which is bad enough to interfere with communication). Negative for congestion, ear discharge, ear pain, postnasal drip, rhinorrhea, sore throat, tinnitus, trouble swallowing and voice change.   Eyes: Negative for pain, redness, itching and visual disturbance.  Respiratory: Negative for cough, choking, shortness of breath and wheezing.   Cardiovascular: Negative for chest pain, palpitations and leg swelling.  Gastrointestinal: Negative for nausea, abdominal pain, diarrhea, constipation and abdominal distention.  Endocrine: Negative for cold intolerance, heat intolerance, polydipsia, polyphagia and polyuria.  Genitourinary: Positive for frequency. Negative for dysuria, urgency, hematuria, flank pain, vaginal discharge, difficulty urinating and pelvic pain.       History urinary infections  Musculoskeletal: Negative for myalgias, back pain, arthralgias, gait problem, neck pain and neck stiffness.  Skin: Negative for color change, pallor and rash.  Allergic/Immunologic: Negative.   Neurological: Negative for dizziness, tremors, seizures, syncope, weakness, numbness and headaches.       Demented  Hematological: Negative for adenopathy. Does not bruise/bleed easily.  Psychiatric/Behavioral: Negative for suicidal ideas, hallucinations, behavioral problems, confusion, sleep disturbance, dysphoric mood and agitation. The patient is not nervous/anxious and is not hyperactive.        History of behavioral problems and difficulty to redirect her activities.    Filed  Vitals:   12/26/14 1628  BP: 143/82  Pulse: 81  Temp: 97 F (36.1 C)  Resp: 19  Height: 4\' 11"  (1.499 m)  Weight: 104 lb (47.174 kg)  SpO2: 94%   Body mass index is 20.99 kg/(m^2).  Physical Exam  Constitutional: She is oriented to person, place, and time. She appears well-developed and well-nourished. No distress.  Generalized weakness  HENT:  Right Ear: External ear normal.  Left Ear: External ear normal.  Nose: Nose normal.  Mouth/Throat: Oropharynx is clear and moist. No oropharyngeal exudate.  Eyes: Conjunctivae and EOM are normal. Pupils are equal, round, and reactive to light. No scleral icterus.  Neck: No JVD present. No tracheal deviation present. No thyromegaly present.  Cardiovascular: Normal rate, regular rhythm and intact distal pulses.  Exam reveals no gallop and no friction rub.   No murmur heard. 3/6 systolic ejection murmur left sternal border.  Pulmonary/Chest: Effort normal. No respiratory distress. She has no wheezes. She has no rales. She exhibits no tenderness.  Bilateral mastectomy scars  Abdominal: She exhibits no distension and no mass. There is no tenderness.  Musculoskeletal: Normal range of motion. She exhibits tenderness. She exhibits no edema.  Lymphadenopathy:    She has no cervical adenopathy.  Neurological: She is alert and oriented to person, place, and time. No cranial nerve deficit. Coordination normal.  Memory impaired  Skin: No rash noted. She is not diaphoretic. No erythema. No pallor.  Psychiatric: Her behavior is normal.  Anxious     Labs reviewed: No visits with results within 3 Month(s) from this visit. Latest known visit with results is:  Lab on 07/19/2014  Component Date Value Ref Range Status  . Glucose 07/18/2014 163   Final  . BUN 07/18/2014 26* 4 - 21 mg/dL Final  . Creatinine 07/18/2014 0.8  0.5 - 1.1 mg/dL Final  . Potassium 07/18/2014 4.6  3.4 - 5.3 mmol/L Final  . Sodium 07/18/2014 134* 137 - 147 mmol/L Final      Assessment/Plan  1. Dementia with behavioral disturbance Unchanged. Progressing slowly. Alzheimer's disease seems to be the most likely etiology. Significant weight loss secondary to progression of her dementia and poor oral intake.  2. Essential hypertension Controlled  3. FTT (failure to thrive) in adult Failure to thrive secondary to progression of her dementia  4. Hypothyroidism, unspecified hypothyroidism type Compensated  5. Type 2 diabetes mellitus with vascular disease Controlled. Last hemoglobin A1c was 5.9.  6. Urinary tract infection without hematuria, site unspecified Resolved

## 2015-01-10 ENCOUNTER — Encounter: Payer: Self-pay | Admitting: Nurse Practitioner

## 2015-01-10 ENCOUNTER — Non-Acute Institutional Stay (SKILLED_NURSING_FACILITY): Payer: Medicare Other | Admitting: Nurse Practitioner

## 2015-01-10 DIAGNOSIS — R627 Adult failure to thrive: Secondary | ICD-10-CM

## 2015-01-10 DIAGNOSIS — I482 Chronic atrial fibrillation, unspecified: Secondary | ICD-10-CM

## 2015-01-10 DIAGNOSIS — E1159 Type 2 diabetes mellitus with other circulatory complications: Secondary | ICD-10-CM

## 2015-01-10 DIAGNOSIS — M159 Polyosteoarthritis, unspecified: Secondary | ICD-10-CM

## 2015-01-10 DIAGNOSIS — E1151 Type 2 diabetes mellitus with diabetic peripheral angiopathy without gangrene: Secondary | ICD-10-CM | POA: Diagnosis not present

## 2015-01-10 DIAGNOSIS — E039 Hypothyroidism, unspecified: Secondary | ICD-10-CM | POA: Diagnosis not present

## 2015-01-10 DIAGNOSIS — M150 Primary generalized (osteo)arthritis: Secondary | ICD-10-CM | POA: Diagnosis not present

## 2015-01-10 DIAGNOSIS — F0391 Unspecified dementia with behavioral disturbance: Secondary | ICD-10-CM | POA: Diagnosis not present

## 2015-01-10 DIAGNOSIS — R609 Edema, unspecified: Secondary | ICD-10-CM | POA: Diagnosis not present

## 2015-01-10 DIAGNOSIS — F03918 Unspecified dementia, unspecified severity, with other behavioral disturbance: Secondary | ICD-10-CM

## 2015-01-10 DIAGNOSIS — K219 Gastro-esophageal reflux disease without esophagitis: Secondary | ICD-10-CM | POA: Diagnosis not present

## 2015-01-10 DIAGNOSIS — M1990 Unspecified osteoarthritis, unspecified site: Secondary | ICD-10-CM | POA: Insufficient documentation

## 2015-01-10 DIAGNOSIS — I10 Essential (primary) hypertension: Secondary | ICD-10-CM | POA: Diagnosis not present

## 2015-01-10 DIAGNOSIS — K5900 Constipation, unspecified: Secondary | ICD-10-CM | POA: Diagnosis not present

## 2015-01-10 NOTE — Assessment & Plan Note (Signed)
Pain with ROM LUE and LLE-goal is comfort measures-continue Tylenol and adding Morphine 5mg  bid and q2h prn for comfort purpose.

## 2015-01-10 NOTE — Assessment & Plan Note (Signed)
Stable. Continue Omeprazole daily

## 2015-01-10 NOTE — Assessment & Plan Note (Signed)
off MiraLax and Amitiza. Continue DulcoLax suppository q3 days if no BM

## 2015-01-10 NOTE — Assessment & Plan Note (Signed)
06/11/14 Metoprolol decreased from 25mg  bid to qam due to refusal.  Heart rate is in control

## 2015-01-10 NOTE — Assessment & Plan Note (Signed)
04/15/14 Hgb A1c 5.9 09/10/14 dc CBG per POA 11/08/14 comfort measures.  11/15/14 off meds.

## 2015-01-10 NOTE — Assessment & Plan Note (Signed)
No longer treating TSH 1.378 01/07/14 07/04/14 TSH 2.553 09/10/14 dc Synthroid per POA

## 2015-01-10 NOTE — Assessment & Plan Note (Signed)
Better with Depakote 125mg  bid since 04/19/13 and able to GDR to 125mg  daily since 8/11/5. Off Zyprexa, Ativan, Amitriptyline.  --stable

## 2015-01-10 NOTE — Assessment & Plan Note (Signed)
Continue to decline.

## 2015-01-10 NOTE — Progress Notes (Signed)
Patient ID: Selena Ward, female   DOB: 1918-09-28, 81 y.o.   MRN: [id number]   Code Status: DNR  Allergies  Allergen Reactions  . Niacin And Related   . Nsaids     Chief Complaint  Patient presents with  . Medical Management of Chronic Issues    HPI: Patient is a 81 y.o. female seen in the SNF at [facility] today for evaluation of chronic medical conditions.  Problem List Items Addressed This Visit    Type 2 diabetes mellitus with vascular disease (Chronic)    04/15/14 Hgb A1c 5.9 09/10/14 dc CBG per POA 11/08/14 comfort measures.  11/15/14 off meds.       Osteoarthritis    Pain with ROM LUE and LLE-goal is comfort measures-continue Tylenol and adding Morphine 5mg  bid and q2h prn for comfort purpose.        Hypothyroidism (Chronic)    No longer treating TSH 1.378 01/07/14 07/04/14 TSH 2.553 09/10/14 dc Synthroid per POA         GERD (gastroesophageal reflux disease)    Stable. Continue Omeprazole daily       FTT (failure to thrive) in adult (Chronic)    Continue to decline.       Essential hypertension - Primary (Chronic)    Controlled, takes Metoprolol 25mg  and  Enalapril 40mg  daily         Edema    Persists dependent edema but no increased SOB, cough, or sputum production. Goal is comfort measures in her care.       Dementia with behavioral disturbance (Chronic)    Better with Depakote 125mg  bid since 04/19/13 and able to GDR to 125mg  daily since 8/11/5. Off Zyprexa, Ativan, Amitriptyline.  --stable       Constipation    off MiraLax and Amitiza. Continue DulcoLax suppository q3 days if no BM        Atrial fibrillation    06/11/14 Metoprolol decreased from 25mg  bid to qam due to refusal.  Heart rate is in control            Review of Systems:  Review of Systems  Constitutional: Negative for fever, chills, diaphoresis, activity change, appetite change, fatigue and unexpected weight change.  HENT: Positive for hearing loss (Severe  hearing loss which is bad enough to interfere with communication). Negative for congestion, ear discharge, ear pain, postnasal drip, rhinorrhea, sore throat, tinnitus, trouble swallowing and voice change.   Eyes: Negative for pain, redness, itching and visual disturbance.  Respiratory: Negative for cough, choking, shortness of breath and wheezing.   Cardiovascular: Positive for leg swelling. Negative for chest pain and palpitations.       LUE and LLE edema.   Gastrointestinal: Negative for nausea, abdominal pain, diarrhea, constipation and abdominal distention.  Endocrine: Negative for cold intolerance, heat intolerance, polydipsia, polyphagia and polyuria.  Genitourinary: Positive for frequency. Negative for dysuria, urgency, hematuria, flank pain, vaginal discharge, difficulty urinating and pelvic pain.       History urinary infections  Musculoskeletal: Negative for myalgias, back pain, arthralgias, gait problem, neck pain and neck stiffness.  Skin: Negative for color change, pallor and rash.  Allergic/Immunologic: Negative.   Neurological: Negative for dizziness, tremors, seizures, syncope, weakness, numbness and headaches.       Demented  Hematological: Negative for adenopathy. Does not bruise/bleed easily.  Psychiatric/Behavioral: Negative for suicidal ideas, hallucinations, behavioral problems, confusion, sleep disturbance, dysphoric mood and agitation. The patient is not nervous/anxious and is not hyperactive.  History of behavioral problems and difficulty to redirect her activities.     Past Medical History  Diagnosis Date  . Depression   . Heart murmur   . Hypertension   . Diabetes mellitus without complication   . CHF (congestive heart failure)   . Anxiety   . GERD (gastroesophageal reflux disease)   . Bilateral breast cancer 2004  . Degeneration of intervertebral disc, site unspecified   . Lumbago   . Disorder of bone and cartilage, unspecified   . Debility,  unspecified   . Senile dementia with depressive features   . Unspecified hypothyroidism   . Other and unspecified hyperlipidemia   . Atrial fibrillation   . Unspecified constipation   . Hydronephrosis   . Senile osteoporosis   . Memory loss   . Closed fracture of anatomical neck of humerus   . Hypopotassemia   . Hydronephrosis    Past Surgical History  Procedure Laterality Date  . Tonsillectomy    . Appendectomy    . Coronary artery bypass graft  2002    x7  . Mastectomy Bilateral 2004  . Vertebroplasty  2009   Social History:   reports that she has quit smoking. She has never used smokeless tobacco. She reports that she does not drink alcohol or use illicit drugs.  Family History  Problem Relation Age of Onset  . Heart disease Father   . Heart disease Brother     Medications: Patient's Medications  New Prescriptions   No medications on file  Previous Medications   ACETAMINOPHEN (TYLENOL) 500 MG TABLET    Take 1,000 mg by mouth 2 (two) times daily.    AMBULATORY NON FORMULARY MEDICATION    Lorazepam 1mg /ml Gel Sig: Apply 69ml topically every 6 hours as needed for agitation   BISACODYL (DULCOLAX) 10 MG SUPPOSITORY    Place 10 mg rectally as needed for moderate constipation.   DIVALPROEX (DEPAKOTE) 125 MG DR TABLET    Take 125 mg by mouth daily.    ENALAPRIL (VASOTEC) 20 MG TABLET    Take 40 mg by mouth daily.   LORAZEPAM (ATIVAN) 0.5 MG TABLET    Take one tablet by mouth every 6 hours as needed for agitation/anxiety   METOPROLOL TARTRATE (LOPRESSOR) 25 MG TABLET    Take 25 mg by mouth daily.    OMEPRAZOLE (PRILOSEC) 20 MG CAPSULE    Take 20 mg by mouth daily.   PRESCRIPTION MEDICATION    Apply 1 mL topically every 6 (six) hours as needed (for agitation). Lorazepam 1mg /ml gel   SENNOSIDES-DOCUSATE SODIUM (SENOKOT-S) 8.6-50 MG TABLET    Take 3 tablets by mouth daily.   Modified Medications   No medications on file  Discontinued Medications   No medications on file      Physical Exam: Physical Exam  Constitutional: She is oriented to person, place, and time. She appears well-developed and well-nourished.  HENT:  Head: Normocephalic and atraumatic.  Eyes: Conjunctivae and EOM are normal. Pupils are equal, round, and reactive to light.  Neck: Normal range of motion. Neck supple. No JVD present. No thyromegaly present.  Cardiovascular: Normal rate and regular rhythm.   Murmur heard.  Systolic murmur is present with a grade of 3/6  3/6  Pulmonary/Chest: Effort normal and breath sounds normal. She has no wheezes. She has no rales.  Abdominal: Soft. Bowel sounds are normal. She exhibits no shifting dullness, no distension and no mass. There is no tenderness. There is no rigidity, no rebound,  no guarding, no CVA tenderness and negative Murphy's sign. No hernia.  Previous exam: Multiple external hemorrhoids.   Genitourinary: Vagina normal. No vaginal discharge found.  Previous exam: Sebaceous cysts seen R+L labia majora. No reddened or swelling areas noted.   Musculoskeletal: Normal range of motion. She exhibits edema. She exhibits no tenderness.  Leaning to her right while in w/c. The right hand grip strength 5/5 but weaker than the left. Dependent edema 1+ LUE and LLE. Pain with ROM LUE and LLE  Lymphadenopathy:    She has no cervical adenopathy.  Neurological: She is alert and oriented to person, place, and time. She has normal reflexes. No cranial nerve deficit. She exhibits normal muscle tone. Coordination normal.  Skin: No rash noted. No erythema.  R+L mastectomy scars.  Psychiatric: Her mood appears anxious (at times). Her affect is angry, labile and inappropriate. Her speech is not delayed and not slurred. She is agitated and aggressive. She is not actively hallucinating. Thought content is delusional. Thought content is not paranoid. Cognition and memory are impaired. She expresses impulsivity and inappropriate judgment. She exhibits abnormal recent  memory and abnormal remote memory.   Filed Vitals:   01/10/15 1233  BP: 102/78  Pulse: 80  Temp: 98.2 F (36.8 C)  TempSrc: Tympanic  Resp: 20      Labs reviewed: Basic Metabolic Panel:  Recent Labs  07/04/14 07/18/14  NA 132* 134*  K 4.1 4.6  BUN 28* 26*  CREATININE 1.0 0.8  TSH 2.55  --    Liver Function Tests:  Recent Labs  07/04/14  AST 16  ALT 13  ALKPHOS 34   No results for input(s): LIPASE, AMYLASE in the last 8760 hours. No results for input(s): AMMONIA in the last 8760 hours. CBC:  Recent Labs  07/04/14  WBC 7.5  HGB 12.5  HCT 38  PLT 300   Lipid Panel: No results for input(s): CHOL, HDL, LDLCALC, TRIG, CHOLHDL, LDLDIRECT in the last 8760 hours.  Past Procedures:  None recently.   Assessment/Plan Edema Persists dependent edema but no increased SOB, cough, or sputum production. Goal is comfort measures in her care.    Atrial fibrillation 06/11/14 Metoprolol decreased from 25mg  bid to qam due to refusal.  Heart rate is in control      Constipation off MiraLax and Amitiza. Continue DulcoLax suppository q3 days if no BM     Dementia with behavioral disturbance Better with Depakote 125mg  bid since 04/19/13 and able to GDR to 125mg  daily since 8/11/5. Off Zyprexa, Ativan, Amitriptyline.  --stable    Essential hypertension Controlled, takes Metoprolol 25mg  and  Enalapril 40mg  daily      FTT (failure to thrive) in adult Continue to decline.    GERD (gastroesophageal reflux disease) Stable. Continue Omeprazole daily    Hypothyroidism No longer treating TSH 1.378 01/07/14 07/04/14 TSH 2.553 09/10/14 dc Synthroid per POA      Type 2 diabetes mellitus with vascular disease 04/15/14 Hgb A1c 5.9 09/10/14 dc CBG per POA 11/08/14 comfort measures.  11/15/14 off meds.    Osteoarthritis Pain with ROM LUE and LLE-goal is comfort measures-continue Tylenol and adding Morphine 5mg  bid and q2h prn for comfort purpose.        Family/ Staff Communication: observe the patient.   Goals of Care: SNF Hospice Service.   Labs/tests ordered: none

## 2015-01-10 NOTE — Assessment & Plan Note (Signed)
Persists dependent edema but no increased SOB, cough, or sputum production. Goal is comfort measures in her care.

## 2015-01-10 NOTE — Assessment & Plan Note (Signed)
Controlled, takes Metoprolol 25mg  and  Enalapril 40mg  daily

## 2015-01-31 DEATH — deceased

## 2015-09-11 NOTE — Progress Notes (Signed)
This encounter was created in error - please disregard.
